# Patient Record
Sex: Female | Born: 1950 | Race: Black or African American | Hispanic: No | Marital: Married | Smoking: Former smoker
Health system: Southern US, Community
[De-identification: ages and names within clinical notes are randomized; demographics above are authoritative.]

## PROBLEM LIST (undated history)

## (undated) DIAGNOSIS — I10 Essential (primary) hypertension: Secondary | ICD-10-CM

## (undated) DIAGNOSIS — I6529 Occlusion and stenosis of unspecified carotid artery: Secondary | ICD-10-CM

## (undated) DIAGNOSIS — E119 Type 2 diabetes mellitus without complications: Secondary | ICD-10-CM

## (undated) HISTORY — DX: Morbid (severe) obesity due to excess calories: E66.01

## (undated) HISTORY — DX: Occlusion and stenosis of unspecified carotid artery: I65.29

## (undated) HISTORY — PX: TONSILLECTOMY: SUR1361

## (undated) HISTORY — DX: Essential (primary) hypertension: I10

---

## 2006-01-02 ENCOUNTER — Emergency Department (HOSPITAL_COMMUNITY): Admission: EM | Admit: 2006-01-02 | Discharge: 2006-01-02 | Payer: Self-pay | Admitting: Emergency Medicine

## 2013-04-19 ENCOUNTER — Other Ambulatory Visit: Payer: Self-pay

## 2013-04-19 DIAGNOSIS — Z1231 Encounter for screening mammogram for malignant neoplasm of breast: Secondary | ICD-10-CM

## 2013-05-09 ENCOUNTER — Ambulatory Visit: Payer: Self-pay

## 2013-12-19 ENCOUNTER — Emergency Department (HOSPITAL_COMMUNITY): Payer: TRICARE For Life (TFL)

## 2013-12-19 ENCOUNTER — Emergency Department (HOSPITAL_COMMUNITY)
Admission: EM | Admit: 2013-12-19 | Discharge: 2013-12-19 | Disposition: A | Discharge status: Home / Self Care | Payer: TRICARE For Life (TFL) | Attending: Emergency Medicine | Admitting: Emergency Medicine

## 2013-12-19 ENCOUNTER — Encounter (HOSPITAL_COMMUNITY): Payer: Self-pay | Admitting: Emergency Medicine

## 2013-12-19 DIAGNOSIS — Z794 Long term (current) use of insulin: Secondary | ICD-10-CM | POA: Insufficient documentation

## 2013-12-19 DIAGNOSIS — R079 Chest pain, unspecified: Secondary | ICD-10-CM

## 2013-12-19 DIAGNOSIS — R42 Dizziness and giddiness: Secondary | ICD-10-CM | POA: Insufficient documentation

## 2013-12-19 DIAGNOSIS — R112 Nausea with vomiting, unspecified: Secondary | ICD-10-CM | POA: Insufficient documentation

## 2013-12-19 DIAGNOSIS — Z79899 Other long term (current) drug therapy: Secondary | ICD-10-CM | POA: Insufficient documentation

## 2013-12-19 DIAGNOSIS — I10 Essential (primary) hypertension: Secondary | ICD-10-CM | POA: Insufficient documentation

## 2013-12-19 DIAGNOSIS — R51 Headache: Secondary | ICD-10-CM | POA: Insufficient documentation

## 2013-12-19 DIAGNOSIS — E119 Type 2 diabetes mellitus without complications: Secondary | ICD-10-CM | POA: Insufficient documentation

## 2013-12-19 DIAGNOSIS — R519 Headache, unspecified: Secondary | ICD-10-CM

## 2013-12-19 HISTORY — DX: Type 2 diabetes mellitus without complications: E11.9

## 2013-12-19 HISTORY — DX: Essential (primary) hypertension: I10

## 2013-12-19 LAB — I-STAT TROPONIN, ED: Troponin i, poc: 0 ng/mL (ref 0.00–0.08)

## 2013-12-19 LAB — COMPREHENSIVE METABOLIC PANEL
ALBUMIN: 3.3 g/dL — AB (ref 3.5–5.2)
ALK PHOS: 95 U/L (ref 39–117)
ALT: 24 U/L (ref 0–35)
AST: 19 U/L (ref 0–37)
BILIRUBIN TOTAL: 0.3 mg/dL (ref 0.3–1.2)
BUN: 17 mg/dL (ref 6–23)
CHLORIDE: 100 meq/L (ref 96–112)
CO2: 22 meq/L (ref 19–32)
CREATININE: 1.07 mg/dL (ref 0.50–1.10)
Calcium: 9.5 mg/dL (ref 8.4–10.5)
GFR calc Af Amer: 63 mL/min — ABNORMAL LOW (ref >90)
GFR, EST NON AFRICAN AMERICAN: 54 mL/min — AB (ref >90)
GLUCOSE: 194 mg/dL — AB (ref 70–99)
POTASSIUM: 4.1 meq/L (ref 3.7–5.3)
Sodium: 139 mEq/L (ref 137–147)
Total Protein: 7.3 g/dL (ref 6.0–8.3)

## 2013-12-19 LAB — CBG MONITORING, ED: GLUCOSE-CAPILLARY: 185 mg/dL — AB (ref 70–99)

## 2013-12-19 LAB — CBC
HEMATOCRIT: 39.1 % (ref 36.0–46.0)
HEMOGLOBIN: 12.8 g/dL (ref 12.0–15.0)
MCH: 26.3 pg (ref 26.0–34.0)
MCHC: 32.7 g/dL (ref 30.0–36.0)
MCV: 80.3 fL (ref 78.0–100.0)
Platelets: 289 10*3/uL (ref 150–400)
RBC: 4.87 MIL/uL (ref 3.87–5.11)
RDW: 13.4 % (ref 11.5–15.5)
WBC: 8 10*3/uL (ref 4.0–10.5)

## 2013-12-19 MED ORDER — ONDANSETRON HCL 4 MG/2ML IJ SOLN
4.0000 mg | Freq: Once | INTRAMUSCULAR | Status: AC
  Administered 2013-12-19: 4 mg via INTRAVENOUS
  Filled 2013-12-19: qty 2

## 2013-12-19 MED ORDER — SODIUM CHLORIDE 0.9 % IV SOLN
INTRAVENOUS | Status: DC
  Administered 2013-12-19: 19:00:00 via INTRAVENOUS

## 2013-12-19 MED ORDER — PROMETHAZINE HCL 25 MG/ML IJ SOLN
12.5000 mg | Freq: Once | INTRAMUSCULAR | Status: AC
  Administered 2013-12-19: 12.5 mg via INTRAVENOUS
  Filled 2013-12-19: qty 1

## 2013-12-19 MED ORDER — HYDROMORPHONE HCL PF 1 MG/ML IJ SOLN
1.0000 mg | Freq: Once | INTRAMUSCULAR | Status: AC
  Administered 2013-12-19: 1 mg via INTRAVENOUS
  Filled 2013-12-19: qty 1

## 2013-12-19 MED ORDER — SODIUM CHLORIDE 0.9 % IV BOLUS (SEPSIS)
500.0000 mL | Freq: Once | INTRAVENOUS | Status: AC
Start: 2013-12-19 — End: 2013-12-19
  Administered 2013-12-19: 500 mL via INTRAVENOUS

## 2013-12-19 NOTE — ED Provider Notes (Signed)
After patient was given additional antiemetics.  Her headache, resolved totally.  She is able to tolerate fluids, and she's feeling better.  She's been encouraged to monitor her blood pressure carefully, and to follow up with her primary care physician  Arman FilterGail K Hadeel Hillebrand, NP 12/19/13 2129

## 2013-12-19 NOTE — ED Notes (Signed)
PT c/o dizziness (like the room is spinning), frontal headache and vomiting since last week. Pt also c/o R sided chest pain since Monday.

## 2013-12-19 NOTE — ED Notes (Signed)
Patient given PO challenge

## 2013-12-19 NOTE — Discharge Instructions (Signed)
Please monitor your blood pressure Follow up with your PCP

## 2013-12-19 NOTE — ED Provider Notes (Signed)
CSN: [id number]     Arrival date & time 12/19/13  1644 History   First MD Initiated Contact with Patient 12/19/13 1710     Chief Complaint  Patient presents with  . Chest Pain  . Headache  . Dizziness     (Consider location/radiation/quality/duration/timing/severity/associated sxs/prior Treatment) Patient is a 63 y.o. female presenting with chest pain, headaches, and dizziness. The history is provided by the patient and the spouse.  Chest Pain Associated symptoms: dizziness, headache, nausea and vomiting   Associated symptoms: no abdominal pain, no back pain, no fever, no numbness, no shortness of breath and no weakness   Headache Associated symptoms: dizziness, nausea and vomiting   Associated symptoms: no abdominal pain, no back pain, no congestion, no fever, no numbness, no photophobia and no seizures   Dizziness Associated symptoms: chest pain, headaches, nausea and vomiting   Associated symptoms: no shortness of breath    patient with one-week history of frontal headache bilateral feels behind the eyes Pain. No visual changes. Associated with some nausea and vomiting for the past several days. Patient has a history of diabetes and hypertension. Blood pressure here today has been significantly elevated. Patient denies fevers denies a stiff neck or neck pain. Has had some chest pain intermittent over the last several days. Today had a brief period of chest pain lasting less than 15 minutes. Yesterday it did last longer. Chest pain is right-sided nonradiating currently 0/10 when it's present is 8/10. Headache is 10 out of 10. Patient primary care Dr. is in [place].  Past Medical History  Diagnosis Date  . Diabetes mellitus without complication   . Hypertension    Past Surgical History  Procedure Laterality Date  . Tonsillectomy     No family history on file. History  Substance Use Topics  . Smoking status: Not on file  . Smokeless tobacco: Not on file  . Alcohol Use: Not on  file   OB History   Grav Para Term Preterm Abortions TAB SAB Ect Mult Living                 Review of Systems  Constitutional: Negative for fever.  HENT: Negative for congestion.   Eyes: Negative for photophobia and visual disturbance.  Respiratory: Negative for shortness of breath.   Cardiovascular: Positive for chest pain.  Gastrointestinal: Positive for nausea and vomiting. Negative for abdominal pain.  Genitourinary: Negative for dysuria.  Musculoskeletal: Negative for back pain.  Skin: Negative for rash.  Neurological: Positive for dizziness and headaches. Negative for seizures, syncope, speech difficulty, weakness and numbness.  Hematological: Does not bruise/bleed easily.  Psychiatric/Behavioral: Negative for confusion.      Allergies  Review of patient's allergies indicates no known allergies.  Home Medications   Current Outpatient Rx  Name  Route  Sig  Dispense  Refill  . Insulin Detemir (LEVEMIR FLEXPEN) 100 UNIT/ML Pen   Subcutaneous   Inject 40 Units into the skin 2 (two) times daily.         . Liraglutide (VICTOZA) 18 MG/3ML SOPN   Subcutaneous   Inject 18 mg into the skin every morning.         Marland Kitchen lisinopril (PRINIVIL,ZESTRIL) 5 MG tablet   Oral   Take 5 mg by mouth daily.         . metFORMIN (GLUCOPHAGE) 500 MG tablet   Oral   Take 500 mg by mouth 2 (two) times daily with a meal.         .  ranitidine (ZANTAC) 150 MG tablet   Oral   Take 150 mg by mouth 2 (two) times daily.         . simvastatin (ZOCOR) 10 MG tablet   Oral   Take 10 mg by mouth daily.          BP 190/58  Pulse 90  Temp(Src) 97.9 F (36.6 C) (Oral)  Resp 18  Ht 5\' 5"  (1.651 m)  Wt 211 lb (95.709 kg)  BMI 35.11 kg/m2  SpO2 99% Physical Exam  Nursing note and vitals reviewed. Constitutional: She is oriented to person, place, and time. She appears well-developed and well-nourished. No distress.  HENT:  Head: Normocephalic and atraumatic.  Mouth/Throat:  Oropharynx is clear and moist.  Eyes: Conjunctivae and EOM are normal. Pupils are equal, round, and reactive to light.  Neck: Normal range of motion. Neck supple.  Cardiovascular: Normal rate, regular rhythm and normal heart sounds.   No murmur heard. Pulmonary/Chest: Effort normal and breath sounds normal. No respiratory distress.  Abdominal: Soft. Bowel sounds are normal. There is no tenderness.  Musculoskeletal: Normal range of motion.  Neurological: She is alert and oriented to person, place, and time. No cranial nerve deficit. She exhibits normal muscle tone. Coordination normal.  Skin: Skin is warm.    ED Course  Procedures (including critical care time) Labs Review Labs Reviewed  COMPREHENSIVE METABOLIC PANEL - Abnormal; Notable for the following:    Glucose, Bld 194 (*)    Albumin 3.3 (*)    GFR calc non Af Amer 54 (*)    GFR calc Af Amer 63 (*)    All other components within normal limits  CBG MONITORING, ED - Abnormal; Notable for the following:    Glucose-Capillary 185 (*)    All other components within normal limits  CBC  I-STAT TROPOININ, ED   Results for orders placed during the hospital encounter of 12/19/13  CBC      Result Value Ref Range   WBC 8.0  4.0 - 10.5 K/uL   RBC 4.87  3.87 - 5.11 MIL/uL   Hemoglobin 12.8  12.0 - 15.0 g/dL   HCT 16.1  09.6 - 04.5 %   MCV 80.3  78.0 - 100.0 fL   MCH 26.3  26.0 - 34.0 pg   MCHC 32.7  30.0 - 36.0 g/dL   RDW 40.9  81.1 - 91.4 %   Platelets 289  150 - 400 K/uL  COMPREHENSIVE METABOLIC PANEL      Result Value Ref Range   Sodium 139  137 - 147 mEq/L   Potassium 4.1  3.7 - 5.3 mEq/L   Chloride 100  96 - 112 mEq/L   CO2 22  19 - 32 mEq/L   Glucose, Bld 194 (*) 70 - 99 mg/dL   BUN 17  6 - 23 mg/dL   Creatinine, Ser 7.82  0.50 - 1.10 mg/dL   Calcium 9.5  8.4 - 95.6 mg/dL   Total Protein 7.3  6.0 - 8.3 g/dL   Albumin 3.3 (*) 3.5 - 5.2 g/dL   AST 19  0 - 37 U/L   ALT 24  0 - 35 U/L   Alkaline Phosphatase 95  39 - 117  U/L   Total Bilirubin 0.3  0.3 - 1.2 mg/dL   GFR calc non Af Amer 54 (*) >90 mL/min   GFR calc Af Amer 63 (*) >90 mL/min  CBG MONITORING, ED      Result Value Ref Range  Glucose-Capillary 185 (*) 70 - 99 mg/dL  I-STAT TROPOININ, ED      Result Value Ref Range   Troponin i, poc 0.00  0.00 - 0.08 ng/mL   Comment 3             Imaging Review Dg Chest 2 View  12/19/2013   CLINICAL DATA:  Dizziness  EXAM: CHEST  2 VIEW  COMPARISON:  None.  FINDINGS: The heart size and mediastinal contours are within normal limits. Both lungs are clear. The visualized skeletal structures are unremarkable.  IMPRESSION: No active cardiopulmonary disease.   Electronically Signed   By: Alcide CleverMark  Lukens M.D.   On: 12/19/2013 18:53   Ct Head Wo Contrast  12/19/2013   CLINICAL DATA:  Dizziness, frontal headache, vomiting  EXAM: CT HEAD WITHOUT CONTRAST  TECHNIQUE: Contiguous axial images were obtained from the base of the skull through the vertex without intravenous contrast.  COMPARISON:  None.  FINDINGS: There is no evidence of mass effect, midline shift or extra-axial fluid collections. There is no evidence of a space-occupying lesion or intracranial hemorrhage. There is no evidence of a cortical-based area of acute infarction.  The ventricles and sulci are appropriate for the patient's age. The basal cisterns are patent.  Visualized portions of the orbits are unremarkable. The visualized portions of the paranasal sinuses and mastoid air cells are unremarkable.  The osseous structures are unremarkable.  IMPRESSION: No acute intracranial pathology.   Electronically Signed   By: Elige KoHetal  Patel   On: 12/19/2013 18:58     EKG Interpretation   Date/Time:  Thursday December 19 2013 16:50:11 EDT Ventricular Rate:  97 PR Interval:  150 QRS Duration: 78 QT Interval:  340 QTC Calculation: 431 R Axis:   100 Text Interpretation:  Normal sinus rhythm Low voltage QRS Possible  Anterolateral infarct , age undetermined Abnormal ECG  Confirmed by  Verlaine Embry  MD, Kaimen Peine [phone] on 12/19/2013 7:06:28 PM      MDM   Final diagnoses:  Headache  Chest pain  Hypertension    Patient presents with the complaint of frontal headache been present for 1 week. Associated with nausea and vomiting for the past several days. Also with some intermittent right-sided chest pain had one episode today lasting 15 minutes had an episode yesterday that was more than 15 minutes. Patient's troponins negative EKG no acute cardiac changes. Head CT negative. Headache improves some with hydromorphone 1 dose. Patient has a known history of hypertension. Blood pressure at the highest was 219/86 after of pain medication was down to 190 systolic. As stated head CT was negative. Labs without significant abnormalities. No leukocytosis no neck stiffness no concerns for meningitis. Patient also has a history of diabetes blood sugar is under 200.  Have re\re dose with another dose of hydromorphone and some Phenergan for the nausea. We'll reassess.    Shelda JakesScott W. Nels Munn, MD 12/19/13 [phone]

## 2013-12-23 NOTE — ED Provider Notes (Signed)
Medical screening examination/treatment/procedure(s) were conducted as a shared visit with non-physician practitioner(s) and myself.  I personally evaluated the patient during the encounter.   EKG Interpretation   Date/Time:  Thursday December 19 2013 16:50:11 EDT Ventricular Rate:  97 PR Interval:  150 QRS Duration: 78 QT Interval:  340 QTC Calculation: 431 R Axis:   100 Text Interpretation:  Normal sinus rhythm Low voltage QRS Possible  Anterolateral infarct , age undetermined Abnormal ECG Confirmed by  Deretha EmoryZACKOWSKI  MD, Aarya Quebedeaux [phone] on 12/19/2013 7:06:28 PM        Shelda JakesScott W. Arlyn Bumpus, MD 12/23/13 [id number]

## 2016-06-01 DIAGNOSIS — E785 Hyperlipidemia, unspecified: Secondary | ICD-10-CM | POA: Insufficient documentation

## 2016-06-01 DIAGNOSIS — E119 Type 2 diabetes mellitus without complications: Secondary | ICD-10-CM | POA: Insufficient documentation

## 2016-06-01 DIAGNOSIS — E11319 Type 2 diabetes mellitus with unspecified diabetic retinopathy without macular edema: Secondary | ICD-10-CM | POA: Insufficient documentation

## 2017-08-08 ENCOUNTER — Other Ambulatory Visit (HOSPITAL_COMMUNITY): Payer: Self-pay | Admitting: Family Medicine

## 2017-08-08 DIAGNOSIS — R0989 Other specified symptoms and signs involving the circulatory and respiratory systems: Secondary | ICD-10-CM

## 2017-08-11 ENCOUNTER — Encounter: Payer: Self-pay | Admitting: Gynecology

## 2017-08-11 ENCOUNTER — Ambulatory Visit (INDEPENDENT_AMBULATORY_CARE_PROVIDER_SITE_OTHER): Payer: Medicare Other | Admitting: Gynecology

## 2017-08-11 VITALS — BP 130/80 | Ht 65.0 in | Wt 236.0 lb

## 2017-08-11 DIAGNOSIS — R87610 Atypical squamous cells of undetermined significance on cytologic smear of cervix (ASC-US): Secondary | ICD-10-CM | POA: Diagnosis not present

## 2017-08-11 DIAGNOSIS — R87810 Cervical high risk human papillomavirus (HPV) DNA test positive: Secondary | ICD-10-CM | POA: Diagnosis not present

## 2017-08-11 NOTE — Progress Notes (Addendum)
    Ashley Sullivan Nov 13, 1950 [id number]        66 y.o.  G1P1 new patient [facility], Lorelee CoverBridget Condit, NP for first abnormal Pap smear showing ASCUS with positive high risk HPV.  Patient presented for her annual exam which included a breast and pelvic exam.  No history of abnormal Pap smears previously.  No new sexual partners.    Past medical history,surgical history, problem list, medications, allergies, family history and social history were all reviewed and documented in the EPIC chart.  Directed ROS with pertinent positives and negatives documented in the history of present illness/assessment and plan.  Exam: Kennon PortelaKim Gardner assistant Vitals:   08/11/17 1133  BP: 130/80  Weight: 236 lb (107 kg)  Height: 5\' 5"  (1.651 m)   General appearance:  Normal Abdomen obese nontender without gross masses Pelvic external BUS vagina with atrophic changes.  Cervix with atrophic changes.  Uterus difficult to palpate but no gross masses or tenderness.  Adnexa without gross masses or tenderness.  Colposcopy performed after acetic acid cleanse was inadequate with no transformation zone seen.  No abnormalities were seen.  Cervical loss was stenotic and subsequently a single-tooth tenaculum was applied to the anterior lip of the cervix and the cervix was gently dilated with a disposable dilator.  No abnormalities again were seen.  An ECC was performed.  Patient tolerated well. Physical Exam  Genitourinary:       Assessment/Plan:  66 y.o. G1P1 with first abnormal Pap smear showing ASCUS with positive high risk HPV.  Colposcopy was inadequate but no abnormality seen.  ECC performed.  If ECC negative then plan expectant management with follow-up Pap smear/HPV in 1 year.  If ECC otherwise abnormal then will triaged based upon results.  Patient and I had an extensive discussion about abnormal Pap smears and the HPV association.  We discussed dysplasia  low-grade/high-grade, progression/regression.  Patient's questions were answered and she was provided with literature discussing these issues.  She will follow-up for biopsy results.  She knows the importance of follow-up long-term.  Greater than 50% of my time was spent in direct face to face counseling and coordination of care with the patient.  Consult note from this visit was sent to the referring provider Valeda MalmBridgit Condit, NP    Dara Lordsimothy P Kerri Kovacik MD, 12:12 PM 08/11/2017

## 2017-08-11 NOTE — Patient Instructions (Signed)
Office will call you with biopsy results 

## 2017-08-12 ENCOUNTER — Other Ambulatory Visit: Payer: Self-pay | Admitting: Family Medicine

## 2017-08-12 DIAGNOSIS — Z780 Asymptomatic menopausal state: Secondary | ICD-10-CM

## 2017-08-15 ENCOUNTER — Ambulatory Visit (HOSPITAL_COMMUNITY)
Admission: RE | Admit: 2017-08-15 | Discharge: 2017-08-15 | Disposition: A | Discharge status: Home / Self Care | Payer: Medicare Other | Source: Ambulatory Visit | Attending: Family Medicine | Admitting: Family Medicine

## 2017-08-15 DIAGNOSIS — R0989 Other specified symptoms and signs involving the circulatory and respiratory systems: Secondary | ICD-10-CM

## 2017-08-15 DIAGNOSIS — I6523 Occlusion and stenosis of bilateral carotid arteries: Secondary | ICD-10-CM | POA: Diagnosis not present

## 2017-08-15 LAB — PATHOLOGY

## 2017-08-15 LAB — TISSUE SPECIMEN

## 2017-08-15 NOTE — Progress Notes (Signed)
Carotid duplex preliminary: Right 60-79% ICA stenosis, high end of range. Left 40-59% ICA stenosis, mid range. Farrel DemarkJill Eunice, RDMS, RVT

## 2017-09-14 NOTE — Progress Notes (Signed)
Cardiology Office Note   Date:  09/15/2017   ID:  Ashley Sullivan, DOB [birth year], MRN [id number]  PCP:  Larene Pickett, FNP  Cardiologist:   Chilton Si, MD   Chief Complaint  Patient presents with  . New Patient (Initial Visit)  . Dizziness    occasionally.     History of Present Illness: Ashley Sullivan is a 67 y.o. female with a moderate carotid stenosis, hypertension and diabetes who is being seen today for the evaluation of carotid stenosis at the request of Larene Pickett, FNP.  Ashley Sullivan saw Ashley Cover, NP on 06/2017 and was noted to have a carotid bruit.  She was referred for carotid Dopplers 08/15/17 that revealed 60-79% right ICA stenosis and left 40-59% ICA stenosis.  Ashley Sullivan has been feeling well.  She denies any headaches, slurred speech, numbness, tingling, or weakness.  She has been seeing an ophthalmologist for diabetic retinopathy.  This is the only known cause for her vision changes in her vision has improved since starting treatment.  She denies chest pain or shortness of breath.  She does not get any lower extremity edema, orthopnea, or PND.  She notes that she is not very active and that she has not been exercising lately.  In the past she liked to work out at [employer] with a Systems analyst.  However, her husband stop paying for the sessions and she stopped exercising.  She recently gave up smoking 4 days ago.  She is previously been able to go up to a year without smoking.  However, when her husband started back smoking she started back to.  She has smoked most of her adult life.  She is currently using nicotine replacement gum.  She notes that her  mother died of a heart attack in her 30s.  She sometimes checks her blood pressure at home and it typically is in the 120s systolic.  She reports being somewhat upset this morning because she had her husband got into an argument.  She thinks that this is why his blood pressure is elevated today.   Past Medical  History:  Diagnosis Date  . Carotid stenosis 09/15/2017  . Diabetes mellitus without complication (HCC)   . Essential hypertension 09/15/2017  . Hypertension   . Morbid obesity (HCC) 09/15/2017    Past Surgical History:  Procedure Laterality Date  . TONSILLECTOMY       Current Outpatient Medications  Medication Sig Dispense Refill  . amLODipine (NORVASC) 10 MG tablet Take 10 mg by mouth daily.    Marland Kitchen aspirin EC 81 MG tablet Take 81 mg by mouth daily.    . Dulaglutide (TRULICITY [place]) Inject into the skin.    . Insulin Degludec (TRESIBA FLEXTOUCH [place]) Inject into the skin.    Marland Kitchen losartan-hydrochlorothiazide (HYZAAR) 100-25 MG tablet Take 1 tablet by mouth daily.    . metFORMIN (GLUCOPHAGE) 500 MG tablet Take 500 mg by mouth 2 (two) times daily with a meal.    . rosuvastatin (CRESTOR) 20 MG tablet Take 20 mg by mouth daily.     No current facility-administered medications for this visit.     Allergies:   Patient has no known allergies.    Social History:  The patient  reports that she has been smoking.  she has never used smokeless tobacco. She reports that she does not drink alcohol or use drugs.   Family History:  The patient's family history includes Diabetes in her mother, sister, sister,  and sister; Heart attack in her mother; Heart failure in her mother.    ROS:  Please see the history of present illness.   Otherwise, review of systems are positive for none.   All other systems are reviewed and negative.    PHYSICAL EXAM: VS:  BP (!) 150/68   Pulse 65   Ht 5\' 5"  (1.651 m)   Wt 241 lb 9.6 oz (109.6 kg)   BMI 40.20 kg/m  , BMI Body mass index is 40.2 kg/m. GENERAL:  Well appearing HEENT:  Pupils equal round and reactive, fundi not visualized, oral mucosa unremarkable NECK:  No jugular venous distention, waveform within normal limits, carotid upstroke brisk and symmetric, bilateral carotid bruits, no thyromegaly LYMPHATICS:  No cervical adenopathy LUNGS:  Clear to auscultation  bilaterally HEART:  RRR.  PMI not displaced or sustained,S1 and S2 within normal limits, no S3, no S4, no clicks, no rubs, no murmurs ABD:  Flat, positive bowel sounds normal in frequency in pitch, no bruits, no rebound, no guarding, no midline pulsatile mass, no hepatomegaly, no splenomegaly EXT:  2 plus radial pulses.  1+ R DP.  Unable to palpate R TP or L DP/TP. No edema, no cyanosis no clubbing SKIN:  No rashes no nodules NEURO:  Cranial nerves II through XII grossly intact, motor grossly intact throughout PSYCH:  Cognitively intact, oriented to person place and time   EKG:  EKG is ordered today. The ekg ordered today demonstrates sinus rhythm rate 65 bpm.  Low voltage precordial leads.  Non-specific ST-T chagnes.    Carotid Dopplers 08/15/17: 60-79% right ICA stenosis and left 40-59% ICA stenosis.  Recent Labs: No results found for requested labs within last 8760 hours.    Lipid Panel No results found for: CHOL, TRIG, HDL, CHOLHDL, VLDL, LDLCALC, LDLDIRECT    Wt Readings from Last 3 Encounters:  09/15/17 241 lb 9.6 oz (109.6 kg)  08/11/17 236 lb (107 kg)  12/19/13 211 lb (95.7 kg)      ASSESSMENT AND PLAN:  # Carotid stenosis:  # Hyperlipidemia: Ashley Sullivan has moderate carotid stenosis bilaterally.  We discussed the importance of continuing to abstain from smoking.  She is already taking aspirin 81 mg daily and rosuvastatin.  We will check her lipids today.  LDL should be less than 70.  Repeat carotid Dopplers 08/2018 and follow-up with me after that.  # Hypertension:  Blood pressure is elevated here both initially and on repeat.  It should be less than 130/80.  She will check her blood pressure twice daily and log the results.  Follow-up with Pharm.D. in 2 weeks for blood pressure check.  Continue amlodipine, losartan, and hydrochlorthiazide.  # Morbid Obesity: Ashley Sullivan was encouraged to increase her exercise to at least 30-40 minutes most days of the week.  She expressed  understanding.     Current medicines are reviewed at length with the patient today.  The patient does not have concerns regarding medicines.  The following changes have been made:  no change  Labs/ tests ordered today include:   Orders Placed This Encounter  Procedures  . Comprehensive metabolic panel  . Lipid panel  . EKG 12-Lead     Disposition:   FU with Ashley Bielicki C. Duke Salviaandolph, MD, [facility] in 1 year.  PharmD in 2 weeks.     This note was written with the assistance of speech recognition software.  Please excuse any transcriptional errors.  Signed, Azul Brumett C. Duke Salviaandolph, MD, [facility]  09/15/2017 9:34 AM  [facility]

## 2017-09-15 ENCOUNTER — Encounter: Payer: Self-pay | Admitting: Cardiovascular Disease

## 2017-09-15 ENCOUNTER — Encounter (INDEPENDENT_AMBULATORY_CARE_PROVIDER_SITE_OTHER): Payer: Self-pay

## 2017-09-15 ENCOUNTER — Ambulatory Visit (INDEPENDENT_AMBULATORY_CARE_PROVIDER_SITE_OTHER): Payer: Medicare Other | Admitting: Cardiovascular Disease

## 2017-09-15 VITALS — BP 150/68 | HR 65 | Ht 65.0 in | Wt 241.6 lb

## 2017-09-15 DIAGNOSIS — I6523 Occlusion and stenosis of bilateral carotid arteries: Secondary | ICD-10-CM

## 2017-09-15 DIAGNOSIS — I10 Essential (primary) hypertension: Secondary | ICD-10-CM

## 2017-09-15 DIAGNOSIS — I6529 Occlusion and stenosis of unspecified carotid artery: Secondary | ICD-10-CM

## 2017-09-15 DIAGNOSIS — Z5181 Encounter for therapeutic drug level monitoring: Secondary | ICD-10-CM | POA: Diagnosis not present

## 2017-09-15 HISTORY — DX: Morbid (severe) obesity due to excess calories: E66.01

## 2017-09-15 HISTORY — DX: Essential (primary) hypertension: I10

## 2017-09-15 HISTORY — DX: Occlusion and stenosis of unspecified carotid artery: I65.29

## 2017-09-15 LAB — COMPREHENSIVE METABOLIC PANEL
ALBUMIN: 3.8 g/dL (ref 3.6–4.8)
ALT: 31 IU/L (ref 0–32)
AST: 30 IU/L (ref 0–40)
Albumin/Globulin Ratio: 1.5 (ref 1.2–2.2)
Alkaline Phosphatase: 74 IU/L (ref 39–117)
BUN / CREAT RATIO: 14 (ref 12–28)
BUN: 24 mg/dL (ref 8–27)
Bilirubin Total: <0.2 mg/dL (ref 0.0–1.2)
CALCIUM: 9.2 mg/dL (ref 8.7–10.3)
CO2: 21 mmol/L (ref 20–29)
CREATININE: 1.72 mg/dL — AB (ref 0.57–1.00)
Chloride: 107 mmol/L — ABNORMAL HIGH (ref 96–106)
GFR calc non Af Amer: 31 mL/min/{1.73_m2} — ABNORMAL LOW (ref >59)
GFR, EST AFRICAN AMERICAN: 35 mL/min/{1.73_m2} — AB (ref >59)
GLUCOSE: 151 mg/dL — AB (ref 65–99)
Globulin, Total: 2.5 g/dL (ref 1.5–4.5)
Potassium: 4.3 mmol/L (ref 3.5–5.2)
Sodium: 142 mmol/L (ref 134–144)
Total Protein: 6.3 g/dL (ref 6.0–8.5)

## 2017-09-15 LAB — LIPID PANEL
Chol/HDL Ratio: 3.4 ratio (ref 0.0–4.4)
Cholesterol, Total: 121 mg/dL (ref 100–199)
HDL: 36 mg/dL — ABNORMAL LOW (ref >39)
LDL CALC: 55 mg/dL (ref 0–99)
Triglycerides: 148 mg/dL (ref 0–149)
VLDL Cholesterol Cal: 30 mg/dL (ref 5–40)

## 2017-09-15 NOTE — Patient Instructions (Signed)
Medication Instructions:  Your physician recommends that you continue on your current medications as directed. Please refer to the Current Medication list given to you today.  Labwork: LP/CMET TODAY   Testing/Procedures: Your physician has requested that you have a carotid duplex. This test is an ultrasound of the carotid arteries in your neck. It looks at blood flow through these arteries that supply the brain with blood. Allow one hour for this exam. There are no restrictions or special instructions. 1 YEAR OV   Follow-Up: Your physician recommends that you schedule a follow-up appointment in: 2 WEEKS WITH PHARM D IN BLOOD PRESSURE CLINIC   Your physician wants you to follow-up in: 1 [facility] You will receive a reminder letter in the mail two months in advance. If you don't receive a letter, please call our office to schedule the follow-up appointment.  Any Other Special Instructions Will Be Listed Below (If Applicable). MONITOR YOUR BLOOD PRESSURE TWICE A DAY FOR 2 WEEKS BRING TO YOUR FOLLOW UP WITH PHARM D   If you need a refill on your cardiac medications before your next appointment, please call your pharmacy.

## 2017-09-15 NOTE — Assessment & Plan Note (Signed)
08/15/17: 60-79% right ICA stenosis and left 40-59% ICA stenosis.

## 2017-09-22 ENCOUNTER — Other Ambulatory Visit: Payer: Self-pay | Admitting: Family Medicine

## 2017-09-22 DIAGNOSIS — E2839 Other primary ovarian failure: Secondary | ICD-10-CM

## 2017-09-25 ENCOUNTER — Inpatient Hospital Stay
Admission: RE | Admit: 2017-09-25 | Discharge: 2017-09-25 | Disposition: A | Discharge status: Home / Self Care | Payer: Medicare Other | Source: Ambulatory Visit | Attending: Family Medicine | Admitting: Family Medicine

## 2017-09-26 ENCOUNTER — Telehealth: Payer: Self-pay | Admitting: *Deleted

## 2017-09-26 NOTE — Telephone Encounter (Signed)
-----   Message from Chilton Siiffany Orme, MD sent at 09/25/2017 10:17 AM EST ----- Kidney function is significantly worse than 3 years ago when last checked.  This may be due to the hypertension.  Repeat BMP when she comes for hypertension clinic.  Follow up with PCP about this.

## 2017-09-26 NOTE — Telephone Encounter (Signed)
Advised patient of lab results, sent to PCP, and order for recheck placed

## 2017-09-28 ENCOUNTER — Encounter: Payer: Self-pay | Admitting: Pharmacist

## 2017-09-28 ENCOUNTER — Ambulatory Visit (INDEPENDENT_AMBULATORY_CARE_PROVIDER_SITE_OTHER): Payer: Medicare Other | Admitting: Pharmacist

## 2017-09-28 VITALS — BP 130/72 | HR 58

## 2017-09-28 DIAGNOSIS — I10 Essential (primary) hypertension: Secondary | ICD-10-CM | POA: Diagnosis not present

## 2017-09-28 DIAGNOSIS — I6523 Occlusion and stenosis of bilateral carotid arteries: Secondary | ICD-10-CM

## 2017-09-28 NOTE — Assessment & Plan Note (Addendum)
According to the 2017 Hypertension Guidelines and the ADA 2018 Guidelines, patient's blood pressure goal is < 130/80. Although patient's blood pressure average in the mornings at home was 142/64, evenings blood pressure average was 122/63. Her BP was within goal in the office today at 130/72.  Most recent BMET done 12 days ago was 1.7 showing an increase form baseline Scr of 1.07. Patient is going to continue blood pressure medications without changes, continue working on lifestyle modifications, and continue to check her blood pressure twice a day to bring record to follow up clinic in 4 weeks. BMET repeat done today to re-assess renal function prior to any additional medication changes.

## 2017-09-28 NOTE — Progress Notes (Signed)
Patient ID: Ashley Sullivan                 DOB: Jun 27, 1951                      MRN: [id number]     HPI: Ashley Sullivan is a 67 y.o. female patient of Dr Duke Salvia, with a PMH significant for carotid stenosis, diabetes mellitus without complication, hypertension, and morbid obesity who presents today for hypertension clinic evaluation. Patient states that she felt dizzy on losartan-hydrochlorothiazide 100-25 mg tablets; therefore, about a year ago she started taking the medication at night and that has helped her feel better. She denies headaches, chest pain, increased fatigue, and swelling.   Current HTN meds:  Amlodipine 10 mg daily (morning) Losartan-Hydrochlorothiazide 100-25 mg tablet daily (bedtime)  Previously tried: None  BP goal: <130/80 (Based on diabetes and hypertension guidelines)  Family History: Her mother had a heart attack at 71 and past away. She had heart failure previously. She does not know the cause of death of her father. Five of her siblings have diabetes and hypertension. One of her siblings just has hypertension.    Social History: She gave up smoking at the beginning of this year. Currently using nicotine replacement gum. She has smoked most of her adult life. She does not drink alcohol.  Diet: She is trying to follow a low sodium diet. She currently is not using salt when she cooks her food. She reports to not eating sausage, bacon, etc.  Exercise:  She walks occasionally with her daughter.  Home BP readings:  She uses a wrist cuff at home. Patient's average morning blood pressure is 142/64 (based on 7 readings) Patient's average nighttime blood pressure is 122/63 (based on 5 readings)  Wt Readings from Last 3 Encounters:  09/15/17 241 lb 9.6 oz (109.6 kg)  08/11/17 236 lb (107 kg)  12/19/13 211 lb (95.7 kg)   BP Readings from Last 3 Encounters:  09/28/17 130/72  09/15/17 (!) 150/68  08/11/17 130/80   Pulse Readings from Last 3 Encounters:  09/28/17 (!) 58   09/15/17 65  12/19/13 75    Renal function: Estimated Creatinine Clearance: 40.2 mL/min (A) (by C-G formula based on SCr of 1.72 mg/dL (H)).  Past Medical History:  Diagnosis Date  . Carotid stenosis 09/15/2017  . Diabetes mellitus without complication (HCC)   . Essential hypertension 09/15/2017  . Hypertension   . Morbid obesity (HCC) 09/15/2017    Current Outpatient Medications on File Prior to Visit  Medication Sig Dispense Refill  . amLODipine (NORVASC) 10 MG tablet Take 10 mg by mouth daily.    Marland Kitchen aspirin EC 81 MG tablet Take 81 mg by mouth daily.    . Dulaglutide (TRULICITY [place]) Inject into the skin.    . Insulin Degludec (TRESIBA FLEXTOUCH [place]) Inject into the skin.    Marland Kitchen losartan-hydrochlorothiazide (HYZAAR) 100-25 MG tablet Take 1 tablet by mouth daily.    . metFORMIN (GLUCOPHAGE) 500 MG tablet Take 500 mg by mouth 2 (two) times daily with a meal.    . rosuvastatin (CRESTOR) 20 MG tablet Take 20 mg by mouth daily.     No current facility-administered medications on file prior to visit.     No Known Allergies  Blood pressure 130/72, pulse (!) 58, SpO2 99 %.  Essential hypertension According to the 2017 Hypertension Guidelines and the ADA 2018 Guidelines, patient's blood pressure goal is < 130/80. Although patient's blood pressure average in  the mornings at home was 142/64, evenings blood pressure average was 122/63. Her BP was within goal in the office today at 130/72.  Most recent BMET done 12 days ago was 1.7 showing an increase form baseline Scr of 1.07. Patient is going to continue blood pressure medications without changes, continue working on lifestyle modifications, and continue to check her blood pressure twice a day to bring record to follow up clinic in 4 weeks. BMET repeat done today to re-assess renal function prior to any additional medication changes.  Jacqualine Codeebecca Dey PharmD Candidate [facility]  Assessment and plan  discussed and approved by: Pearletha Furlaquel Rodriguez-Guzman PharmD, BCPS, CPP [facility] [address] [place] [phone] 09/28/2017 3:21 PM

## 2017-09-28 NOTE — Patient Instructions (Signed)
Return for a follow up appointment in February 19th at 2:30pm.  Check your blood pressure at home daily (if able) and keep record of the readings.  Take your BP meds as follows: no changes in medications.  Bring all of your meds, your BP cuff and your record of home blood pressures to your next appointment.  Exercise as you're able, try to walk approximately 30 minutes per day.  Keep salt intake to a minimum, especially watch canned and prepared boxed foods.  Eat more fresh fruits and vegetables and fewer canned items.  Avoid eating in fast food restaurants.    HOW TO TAKE YOUR BLOOD PRESSURE: . Rest 5 minutes before taking your blood pressure. .  Don't smoke or drink caffeinated beverages for at least 30 minutes before. . Take your blood pressure before (not after) you eat. . Sit comfortably with your back supported and both feet on the floor (don't cross your legs). . Elevate your arm to heart level on a table or a desk. . Use the proper sized cuff. It should fit smoothly and snugly around your bare upper arm. There should be enough room to slip a fingertip under the cuff. The bottom edge of the cuff should be 1 inch above the crease of the elbow. . Ideally, take 3 measurements at one sitting and record the average.

## 2017-09-29 LAB — BASIC METABOLIC PANEL
BUN/Creatinine Ratio: 12 (ref 12–28)
BUN: 31 mg/dL — AB (ref 8–27)
CO2: 22 mmol/L (ref 20–29)
Calcium: 9.5 mg/dL (ref 8.7–10.3)
Chloride: 106 mmol/L (ref 96–106)
Creatinine, Ser: 2.69 mg/dL — ABNORMAL HIGH (ref 0.57–1.00)
GFR calc Af Amer: 21 mL/min/{1.73_m2} — ABNORMAL LOW (ref >59)
GFR, EST NON AFRICAN AMERICAN: 18 mL/min/{1.73_m2} — AB (ref >59)
GLUCOSE: 173 mg/dL — AB (ref 65–99)
Potassium: 4.8 mmol/L (ref 3.5–5.2)
SODIUM: 144 mmol/L (ref 134–144)

## 2017-10-10 ENCOUNTER — Telehealth: Payer: Self-pay | Admitting: *Deleted

## 2017-10-10 NOTE — Telephone Encounter (Signed)
-----   Message from Chilton Siiffany , MD sent at 10/04/2017  5:58 PM EST ----- Kidney function is getting progressively worse.  Cut losartan/HCTZ in half.  Add hydralazine 25mg  tid.  She needs follow up with us within one week for BP check and a nephrology referral.

## 2017-10-10 NOTE — Telephone Encounter (Signed)
Left second message to call back.

## 2017-10-13 ENCOUNTER — Ambulatory Visit
Admission: RE | Admit: 2017-10-13 | Discharge: 2017-10-13 | Disposition: A | Discharge status: Home / Self Care | Payer: Medicare Other | Source: Ambulatory Visit | Attending: Family Medicine | Admitting: Family Medicine

## 2017-10-13 DIAGNOSIS — E2839 Other primary ovarian failure: Secondary | ICD-10-CM

## 2017-10-17 NOTE — Telephone Encounter (Signed)
Left message to call back  

## 2017-10-31 ENCOUNTER — Ambulatory Visit (INDEPENDENT_AMBULATORY_CARE_PROVIDER_SITE_OTHER): Payer: Medicare Other | Admitting: Pharmacist

## 2017-10-31 ENCOUNTER — Encounter: Payer: Self-pay | Admitting: Pharmacist

## 2017-10-31 VITALS — BP 116/72 | HR 64

## 2017-10-31 DIAGNOSIS — I6523 Occlusion and stenosis of bilateral carotid arteries: Secondary | ICD-10-CM | POA: Diagnosis not present

## 2017-10-31 DIAGNOSIS — I10 Essential (primary) hypertension: Secondary | ICD-10-CM

## 2017-10-31 NOTE — Telephone Encounter (Signed)
Notes recorded by Regis BillPratt, Casee Knepp B on 10/31/2017 at 4:28 PM EST Patient seen by Pharm D today, med changes made by PCP and follow up labs done with improvement. Blood pressure well controlled in blood pressure clinic

## 2017-10-31 NOTE — Progress Notes (Signed)
Patient ID: Ashley Sullivan                 DOB: 1951-07-27                      MRN: [id number]     HPI: Ashley Sullivan is a 67 y.o. female patient of Dr Duke Salvia, with a PMH significant for carotid stenosis, diabetes mellitus, hypertension, and morbid obesity.  During most recent office visit her medication was continued without changes ;unfortunately her repeat BMET showed acute renal problems with Scr increase form 1.72 on 1/4 to 2.69 on 1/17.  Per PCP recommendations metformin and losartan were discontinued and replaced by pioglitazone and HCTZ 25mg  daily. Repeat BMET on 10/17/2017 showed an improved Scr of 1.73 Patient presents today for HTN follow up and denies swelling, dizziness, headaches or shortness of breath.    Current HTN meds:  Amlodipine 10 mg daily (morning) HCTZ 25mg  daily (morning)  Previously tried: None  BP goal: <130/80 (Based on diabetes and hypertension guidelines)  Family History: Her mother had a heart attack at 67 and past away. She had heart failure previously. She does not know the cause of death of her father. Five of her siblings have diabetes and hypertension. One of her siblings just has hypertension.    Social History: STOPPED smoking ~ 2 months ago. Denies alcohol.  Diet: She is trying to follow a low sodium diet. She currently is not using salt when she cooks her food. She reports to not eating sausage, bacon, etc.  Exercise:  She walks occasionally with her daughter.  Home BP readings: none available for assessment  Wt Readings from Last 3 Encounters:  09/15/17 241 lb 9.6 oz (109.6 kg)  08/11/17 236 lb (107 kg)  12/19/13 211 lb (95.7 kg)   BP Readings from Last 3 Encounters:  10/31/17 116/72  09/28/17 130/72  09/15/17 (!) 150/68   Pulse Readings from Last 3 Encounters:  10/31/17 64  09/28/17 (!) 58  09/15/17 65    Past Medical History:  Diagnosis Date  . Carotid stenosis 09/15/2017  . Diabetes mellitus without complication (HCC)   . Essential  hypertension 09/15/2017  . Hypertension   . Morbid obesity (HCC) 09/15/2017    Current Outpatient Medications on File Prior to Visit  Medication Sig Dispense Refill  . amLODipine (NORVASC) 10 MG tablet Take 10 mg by mouth daily.    Marland Kitchen aspirin EC 81 MG tablet Take 81 mg by mouth daily.    . Dulaglutide (TRULICITY [place]) Inject into the skin.    . hydrochlorothiazide (HYDRODIURIL) 25 MG tablet Take 25 mg by mouth daily.    . Insulin Degludec (TRESIBA FLEXTOUCH ) Inject into the skin.    . pioglitazone (ACTOS) 15 MG tablet Take 15 mg by mouth daily.    . rosuvastatin (CRESTOR) 20 MG tablet Take 20 mg by mouth daily.     No current facility-administered medications on file prior to visit.     No Known Allergies  Blood pressure 116/72, pulse 64, SpO2 99 %.  Essential hypertension Blood pressure today is well controlled at 116/72. Patient stopped smoking about 2 months ago, and continues to work on lifestyle modifications. She denies problem with current therapy and her renal function is much improved since losartan and metformin were discontinue. Will continue current therapy without changes, and follow up with HTN clinic as needed.    Dary Dilauro Rodriguez-Guzman PharmD, BCPS, CPP [facility] [address]  [place] [phone] 10/31/2017 4:27 PM

## 2017-10-31 NOTE — Assessment & Plan Note (Signed)
Blood pressure today is well controlled at 116/72. Patient stopped smoking about 2 months ago, and continues to work on lifestyle modifications. She denies problem with current therapy and her renal function is much improved since losartan and metformin were discontinue. Will continue current therapy without changes, and follow up with HTN clinic as needed.

## 2017-10-31 NOTE — Patient Instructions (Addendum)
Return for a follow up appointment as needed  Check your blood pressure at home daily (if able) and keep record of the readings.  Take your BP meds as follows: *NO CHANGES*  Bring all of your meds, your BP cuff and your record of home blood pressures to your next appointment.  Exercise as you're able, try to walk approximately 30 minutes per day.  Keep salt intake to a minimum, especially watch canned and prepared boxed foods.  Eat more fresh fruits and vegetables and fewer canned items.  Avoid eating in fast food restaurants.    HOW TO TAKE YOUR BLOOD PRESSURE: . Rest 5 minutes before taking your blood pressure. .  Don't smoke or drink caffeinated beverages for at least 30 minutes before. . Take your blood pressure before (not after) you eat. . Sit comfortably with your back supported and both feet on the floor (don't cross your legs). . Elevate your arm to heart level on a table or a desk. . Use the proper sized cuff. It should fit smoothly and snugly around your bare upper arm. There should be enough room to slip a fingertip under the cuff. The bottom edge of the cuff should be 1 inch above the crease of the elbow. . Ideally, take 3 measurements at one sitting and record the average.    

## 2017-11-03 NOTE — Telephone Encounter (Signed)
Per Dr Duke Salviaandolph ok to allow PCP to follow kidney functions

## 2018-06-29 ENCOUNTER — Other Ambulatory Visit: Payer: Self-pay | Admitting: Nephrology

## 2018-06-29 DIAGNOSIS — N183 Chronic kidney disease, stage 3 unspecified: Secondary | ICD-10-CM

## 2018-06-29 DIAGNOSIS — I129 Hypertensive chronic kidney disease with stage 1 through stage 4 chronic kidney disease, or unspecified chronic kidney disease: Secondary | ICD-10-CM

## 2018-07-03 ENCOUNTER — Ambulatory Visit
Admission: RE | Admit: 2018-07-03 | Discharge: 2018-07-03 | Disposition: A | Discharge status: Home / Self Care | Payer: Medicare Other | Source: Ambulatory Visit | Attending: Nephrology | Admitting: Nephrology

## 2018-07-03 DIAGNOSIS — N183 Chronic kidney disease, stage 3 unspecified: Secondary | ICD-10-CM

## 2018-07-03 DIAGNOSIS — I129 Hypertensive chronic kidney disease with stage 1 through stage 4 chronic kidney disease, or unspecified chronic kidney disease: Secondary | ICD-10-CM

## 2018-09-27 ENCOUNTER — Ambulatory Visit (HOSPITAL_COMMUNITY)
Admission: RE | Admit: 2018-09-27 | Payer: Medicare Other | Source: Ambulatory Visit | Attending: Cardiovascular Disease | Admitting: Cardiovascular Disease

## 2018-12-22 ENCOUNTER — Emergency Department
Admission: EM | Admit: 2018-12-22 | Discharge: 2018-12-22 | Disposition: A | Discharge status: Home / Self Care | Payer: Medicare Other | Attending: Emergency Medicine | Admitting: Emergency Medicine

## 2018-12-22 ENCOUNTER — Other Ambulatory Visit: Payer: Self-pay

## 2018-12-22 ENCOUNTER — Encounter: Payer: Self-pay | Admitting: Emergency Medicine

## 2018-12-22 ENCOUNTER — Emergency Department: Payer: Medicare Other

## 2018-12-22 DIAGNOSIS — I10 Essential (primary) hypertension: Secondary | ICD-10-CM | POA: Diagnosis not present

## 2018-12-22 DIAGNOSIS — Z87891 Personal history of nicotine dependence: Secondary | ICD-10-CM | POA: Diagnosis not present

## 2018-12-22 DIAGNOSIS — E119 Type 2 diabetes mellitus without complications: Secondary | ICD-10-CM | POA: Insufficient documentation

## 2018-12-22 DIAGNOSIS — K209 Esophagitis, unspecified without bleeding: Secondary | ICD-10-CM

## 2018-12-22 DIAGNOSIS — Z79899 Other long term (current) drug therapy: Secondary | ICD-10-CM | POA: Insufficient documentation

## 2018-12-22 DIAGNOSIS — K2970 Gastritis, unspecified, without bleeding: Secondary | ICD-10-CM | POA: Diagnosis not present

## 2018-12-22 DIAGNOSIS — R079 Chest pain, unspecified: Secondary | ICD-10-CM | POA: Diagnosis present

## 2018-12-22 LAB — COMPREHENSIVE METABOLIC PANEL
ALT: 30 U/L (ref 0–44)
AST: 34 U/L (ref 15–41)
Albumin: 3.6 g/dL (ref 3.5–5.0)
Alkaline Phosphatase: 61 U/L (ref 38–126)
Anion gap: 11 (ref 5–15)
BUN: 39 mg/dL — ABNORMAL HIGH (ref 8–23)
CO2: 26 mmol/L (ref 22–32)
Calcium: 9.8 mg/dL (ref 8.9–10.3)
Chloride: 102 mmol/L (ref 98–111)
Creatinine, Ser: 3.42 mg/dL — ABNORMAL HIGH (ref 0.44–1.00)
GFR calc Af Amer: 15 mL/min — ABNORMAL LOW (ref >60)
GFR calc non Af Amer: 13 mL/min — ABNORMAL LOW (ref >60)
Glucose, Bld: 148 mg/dL — ABNORMAL HIGH (ref 70–99)
Potassium: 3 mmol/L — ABNORMAL LOW (ref 3.5–5.1)
Sodium: 139 mmol/L (ref 135–145)
Total Bilirubin: 0.7 mg/dL (ref 0.3–1.2)
Total Protein: 7.6 g/dL (ref 6.5–8.1)

## 2018-12-22 LAB — CBC WITH DIFFERENTIAL/PLATELET
Abs Immature Granulocytes: 0.01 10*3/uL (ref 0.00–0.07)
Basophils Absolute: 0 10*3/uL (ref 0.0–0.1)
Basophils Relative: 0 %
Eosinophils Absolute: 0.1 10*3/uL (ref 0.0–0.5)
Eosinophils Relative: 1 %
HCT: 41.1 % (ref 36.0–46.0)
Hemoglobin: 12.8 g/dL (ref 12.0–15.0)
Immature Granulocytes: 0 %
Lymphocytes Relative: 28 %
Lymphs Abs: 2.1 10*3/uL (ref 0.7–4.0)
MCH: 25.5 pg — ABNORMAL LOW (ref 26.0–34.0)
MCHC: 31.1 g/dL (ref 30.0–36.0)
MCV: 81.9 fL (ref 80.0–100.0)
Monocytes Absolute: 0.8 10*3/uL (ref 0.1–1.0)
Monocytes Relative: 10 %
Neutro Abs: 4.5 10*3/uL (ref 1.7–7.7)
Neutrophils Relative %: 61 %
Platelets: 285 10*3/uL (ref 150–400)
RBC: 5.02 MIL/uL (ref 3.87–5.11)
RDW: 15.2 % (ref 11.5–15.5)
WBC: 7.5 10*3/uL (ref 4.0–10.5)
nRBC: 0 % (ref 0.0–0.2)

## 2018-12-22 LAB — TROPONIN I: Troponin I: <0.03 ng/mL (ref <0.03)

## 2018-12-22 LAB — LIPASE, BLOOD: Lipase: 37 U/L (ref 11–51)

## 2018-12-22 MED ORDER — LIDOCAINE VISCOUS HCL 2 % MT SOLN
15.0000 mL | Freq: Once | OROMUCOSAL | Status: AC
  Administered 2018-12-22: 15 mL via OROMUCOSAL
  Filled 2018-12-22: qty 15

## 2018-12-22 MED ORDER — ONDANSETRON 4 MG PO TBDP
4.0000 mg | ORAL_TABLET | Freq: Once | ORAL | Status: AC
  Administered 2018-12-22: 4 mg via ORAL
  Filled 2018-12-22: qty 1

## 2018-12-22 MED ORDER — PANTOPRAZOLE SODIUM 20 MG PO TBEC: 20.0000 mg | DELAYED_RELEASE_TABLET | Freq: Every day | ORAL | 1 refills | Status: DC

## 2018-12-22 MED ORDER — ALUM & MAG HYDROXIDE-SIMETH 200-200-20 MG/5ML PO SUSP
30.0000 mL | Freq: Once | ORAL | Status: AC
  Administered 2018-12-22: 30 mL via ORAL
  Filled 2018-12-22: qty 30

## 2018-12-22 MED ORDER — ONDANSETRON 4 MG PO TBDP: 4.0000 mg | ORAL_TABLET | Freq: Three times a day (TID) | ORAL | 0 refills | Status: DC | PRN

## 2018-12-22 MED ORDER — LIDOCAINE VISCOUS HCL 2 % MT SOLN
15.0000 mL | Freq: Once | OROMUCOSAL | Status: AC
  Administered 2018-12-22: 15 mL via ORAL
  Filled 2018-12-22: qty 15

## 2018-12-22 NOTE — ED Provider Notes (Signed)
[facility] Emergency Department Provider Note   ____________________________________________    I have reviewed the triage vital signs and the nursing notes.   HISTORY  Chief Complaint Heartburn and Emesis     HPI Ashley Sullivan is a 69 y.o. female who presents with complaints of heartburn/chest discomfort.  Patient reports her husband has been in the hospital and she has been quite stressed.  Yesterday she was feeling nauseated which she attributes to stress.  She vomited several times.  Since that time she has had a burning sensation in her central chest.  She denies chest pressure.  No radiation of pain.  No back pain.  No fevers or chills or cough.  No shortness of breath.  No calf pain or swelling.  Has not taken anything for this.  Past Medical History:  Diagnosis Date  . Carotid stenosis 09/15/2017  . Diabetes mellitus without complication (HCC)   . Essential hypertension 09/15/2017  . Hypertension   . Morbid obesity (HCC) 09/15/2017    Patient Active Problem List   Diagnosis Date Noted  . Carotid stenosis 09/15/2017  . Essential hypertension 09/15/2017  . Morbid obesity (HCC) 09/15/2017  . Diabetic retinopathy (HCC) 06/01/2016  . Hyperlipidemia 06/01/2016  . Type 2 diabetes mellitus (HCC) 06/01/2016    Past Surgical History:  Procedure Laterality Date  . TONSILLECTOMY      Prior to Admission medications   Medication Sig Start Date End Date Taking? Authorizing Provider  amLODipine (NORVASC) 10 MG tablet Take 10 mg by mouth daily.    [provider]  aspirin EC 81 MG tablet Take 81 mg by mouth daily.    [provider]  Dulaglutide (TRULICITY [place]) Inject into the skin.    [provider]  hydrochlorothiazide (HYDRODIURIL) 25 MG tablet Take 25 mg by mouth daily.    [provider]  Insulin Degludec (TRESIBA FLEXTOUCH [place]) Inject into the skin.    [provider]  ondansetron (ZOFRAN ODT) 4 MG  disintegrating tablet Take 1 tablet (4 mg total) by mouth every 8 (eight) hours as needed. 12/22/18   Jene Every, MD  pantoprazole (PROTONIX) 20 MG tablet Take 1 tablet (20 mg total) by mouth daily. 12/22/18 12/22/19  Jene Every, MD  pioglitazone (ACTOS) 15 MG tablet Take 15 mg by mouth daily.    [provider]  rosuvastatin (CRESTOR) 20 MG tablet Take 20 mg by mouth daily.    [provider]     Allergies Patient has no known allergies.  Family History  Problem Relation Age of Onset  . Diabetes Mother   . Heart failure Mother   . Heart attack Mother   . Diabetes Sister   . Diabetes Sister   . Diabetes Sister     Social History Social History   Tobacco Use  . Smoking status: Former Smoker    Types: Cigarettes    Last attempt to quit: 09/12/2017    Years since quitting: 1.2  . Smokeless tobacco: Never Used  Substance Use Topics  . Alcohol use: No    Frequency: Never  . Drug use: No    Review of Systems  Constitutional: No fever/chills Eyes: No visual changes.  ENT: No sore throat. Cardiovascular: As above Respiratory: Denies shortness of breath. Gastrointestinal: No abdominal pain.  As above Genitourinary: Negative for dysuria. Musculoskeletal: Negative for back pain. Skin: Negative for rash. Neurological: Negative for headaches    ____________________________________________   PHYSICAL EXAM:  VITAL SIGNS: ED  Triage Vitals  Enc Vitals Group     BP 12/22/18 0813 (!) 157/63     Pulse Rate 12/22/18 0813 78     Resp 12/22/18 0813 16     Temp 12/22/18 0813 98.1 F (36.7 C)     Temp Source 12/22/18 0813 Oral     SpO2 12/22/18 0813 99 %     Weight 12/22/18 0811 104.3 kg (230 lb)     Height 12/22/18 0811 1.651 m (5\' 5" )     Head Circumference --      Peak Flow --      Pain Score 12/22/18 0810 0     Pain Loc --      Pain Edu? --      Excl. in GC? --     Constitutional: Alert and oriented. No acute distress.  Nose: No  congestion/rhinnorhea. Mouth/Throat: Mucous membranes are moist.    Cardiovascular: Normal rate, regular rhythm. Grossly normal heart sounds.  Good peripheral circulation. Respiratory: Normal respiratory effort.  No retractions. Lungs CTAB. Gastrointestinal: Soft and nontender. No distention.    Musculoskeletal: No lower extremity tenderness nor edema.  Warm and well perfused Neurologic:  Normal speech and language. No gross focal neurologic deficits are appreciated.  Skin:  Skin is warm, dry and intact. No rash noted. Psychiatric: Mood and affect are normal. Speech and behavior are normal.  ____________________________________________   LABS (all labs ordered are listed, but only abnormal results are displayed)  Labs Reviewed  CBC WITH DIFFERENTIAL/PLATELET - Abnormal; Notable for the following components:      Result Value   MCH 25.5 (*)    All other components within normal limits  COMPREHENSIVE METABOLIC PANEL - Abnormal; Notable for the following components:   Potassium 3.0 (*)    Glucose, Bld 148 (*)    BUN 39 (*)    Creatinine, Ser 3.42 (*)    GFR calc non Af Amer 13 (*)    GFR calc Af Amer 15 (*)    All other components within normal limits  TROPONIN I  LIPASE, BLOOD   ____________________________________________  EKG  ED ECG REPORT I, Jene Every, the attending physician, personally viewed and interpreted this ECG.  Date: 12/22/2018  Rhythm: normal sinus rhythm QRS Axis: normal Intervals: normal ST/T Wave abnormalities: normal Narrative Interpretation: no evidence of acute ischemia  ____________________________________________  RADIOLOGY  None ____________________________________________   PROCEDURES  Procedure(s) performed: No  Procedures   Critical Care performed: No ____________________________________________   INITIAL IMPRESSION / ASSESSMENT AND PLAN / ED COURSE  Pertinent labs & imaging results that were available during my care of  the patient were reviewed by me and considered in my medical decision making (see chart for details).  Patient presents with complaints of burning chest discomfort.  Differential certainly includes heartburn/esophagitis related to vomiting, less likely ACS, pericarditis, not consistent with dissection.  Given her age and risk factors we will send labs including EKG   EKG is overall reassuring.  Will treat with p.o. Zofran and trial GI cocktail  Patient had significant improvement after GI cocktail consistent with esophagitis/gastritis.  Lab work is greatly reassuring.  She has chronic kidney disease for which she sees nephrology, her creatinine today is not significantly changed from her prior.  Given that she is feeling better, has no abdominal pain and reassuring exam with reassuring labs will discharge with p.o. Zofran and Protonix.  Strict return precautions discussed.  Outpatient follow-up as needed    ____________________________________________   FINAL CLINICAL  IMPRESSION(S) / ED DIAGNOSES  Final diagnoses:  Esophagitis with gastritis        Note:  This document was prepared using Dragon voice recognition software and may include unintentional dictation errors.   Jene EveryKinner, Harshaan Whang, MD 12/22/18 1334

## 2018-12-22 NOTE — ED Notes (Signed)
AAOx3.  Skin warm and dry.  MM Moist.  Patient describes being under stress lately, due to husband being hospitalized for the past 6 weeks.  States she has been anxious and had intermittent nausea with some emesis this morning.  Emotional support given, well received.

## 2018-12-22 NOTE — ED Notes (Signed)
AAOx3.  Skin warm and dry.  Sitting up at bedside. Tolerating ginger ale.  NAD

## 2018-12-22 NOTE — ED Triage Notes (Signed)
Pt to ED via POV c/o heartburn and emesis since yesterday. Pt states that she has vomited 4-5 times in the last 24 hours. Pt denies hx/o GERD, pt has used OTC medication with mild relief. Pt is in NAD at this time.

## 2018-12-22 NOTE — ED Notes (Signed)
AAOx3.  Skin warm and dry.  NAD 

## 2018-12-22 NOTE — ED Notes (Signed)
Patient denies pain and is resting comfortably.  

## 2018-12-31 ENCOUNTER — Telehealth: Payer: Self-pay | Admitting: *Deleted

## 2018-12-31 NOTE — Telephone Encounter (Signed)
Mrs.Zumbro, refused appointment at this time.She wants to wait later.Deferred recall.

## 2019-06-19 ENCOUNTER — Encounter: Payer: Self-pay | Admitting: Gynecology

## 2020-05-01 IMAGING — US US RENAL
1 series · 14 of 25 positions shown · non-contrast
Comparison: None.

CLINICAL DATA: Chronic kidney disease, stage III

EXAM:
RENAL / URINARY TRACT ULTRASOUND COMPLETE

[Series 1: us renal · 0.27mm/px · 14 of 33 slices shown]
[im 1/33]
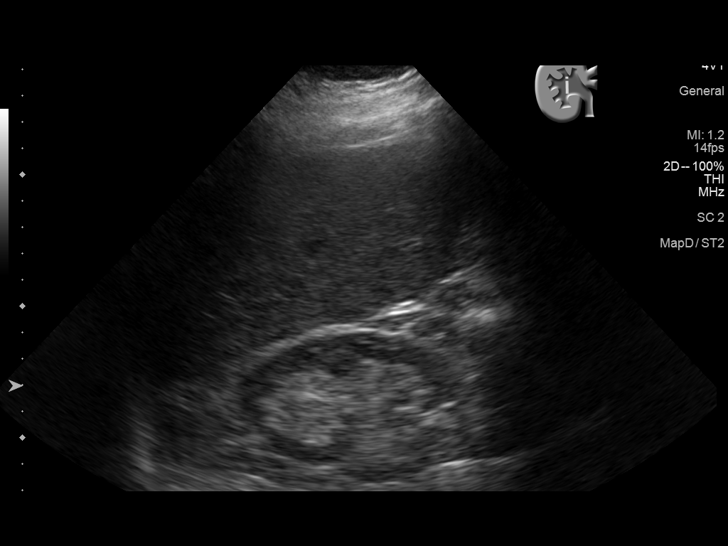
[im 3/33]
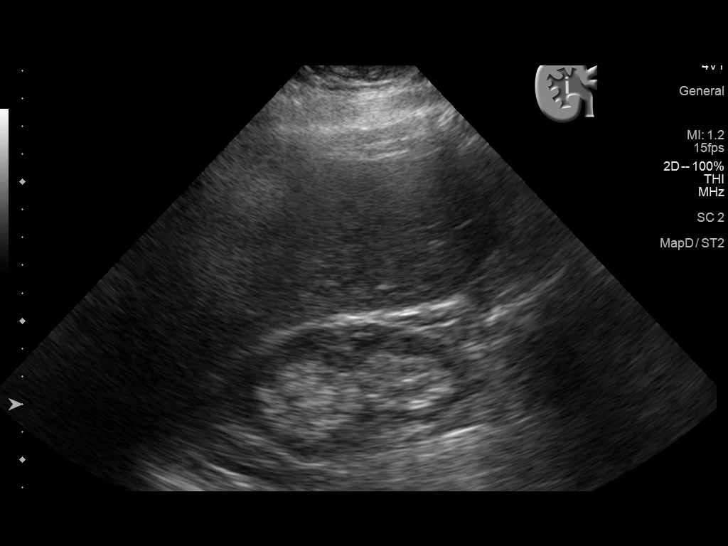
[im 6/33]
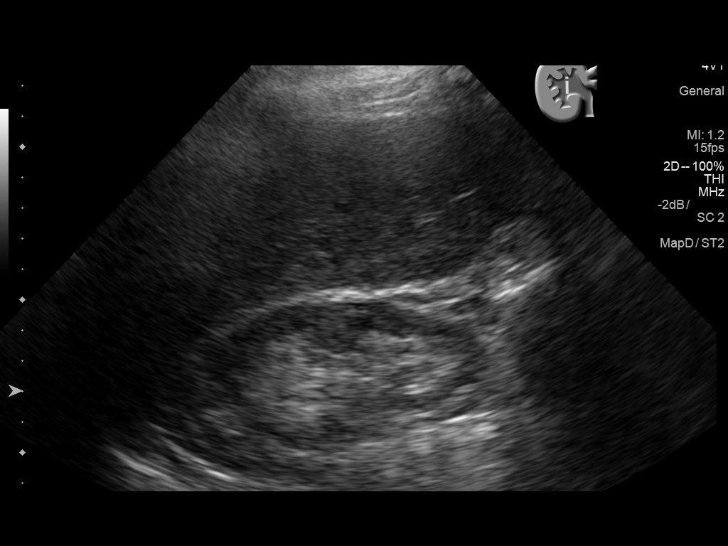
[im 9/33]
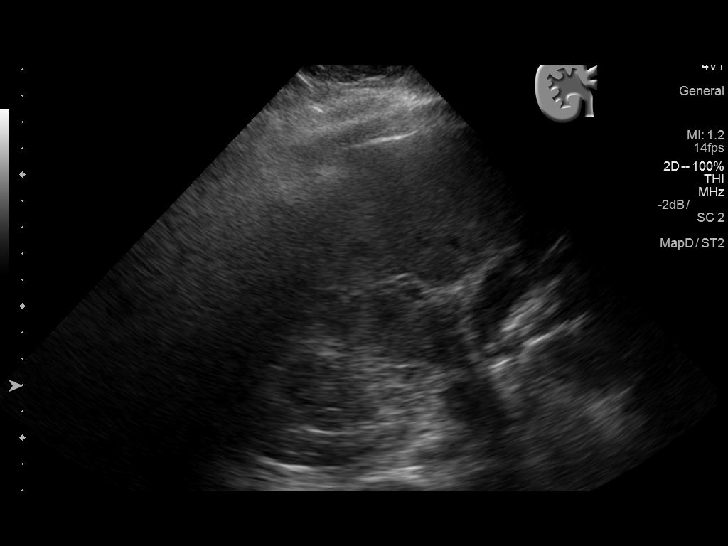
[im 11/33]
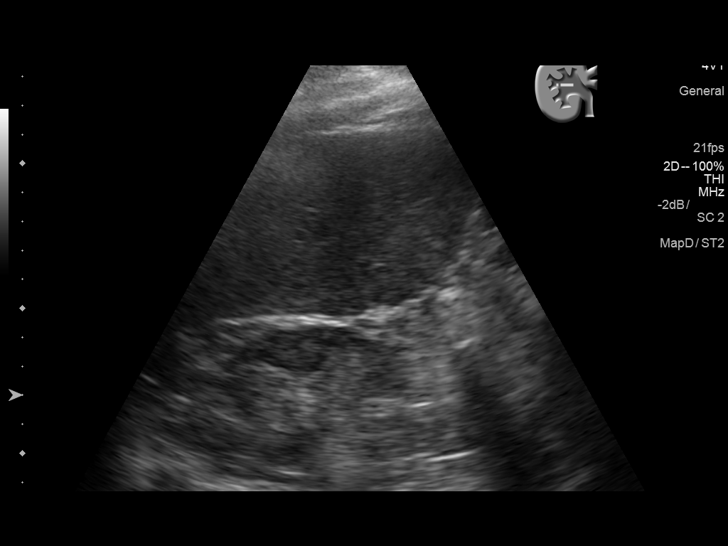
[im 13/33]
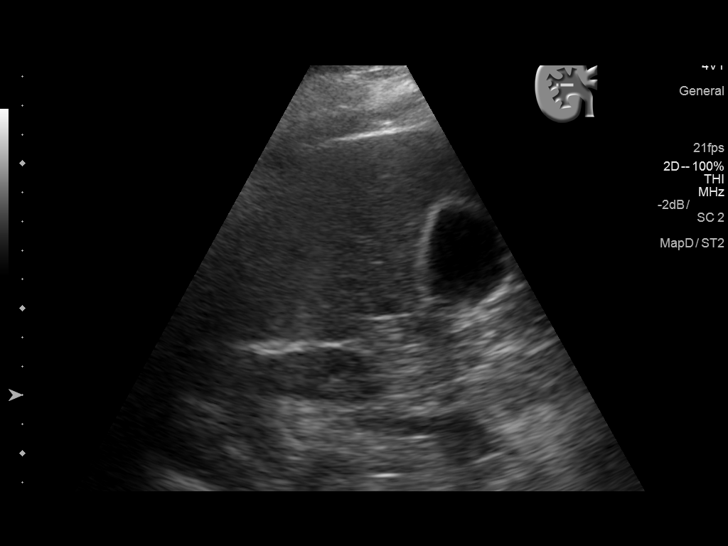
[im 15/33]
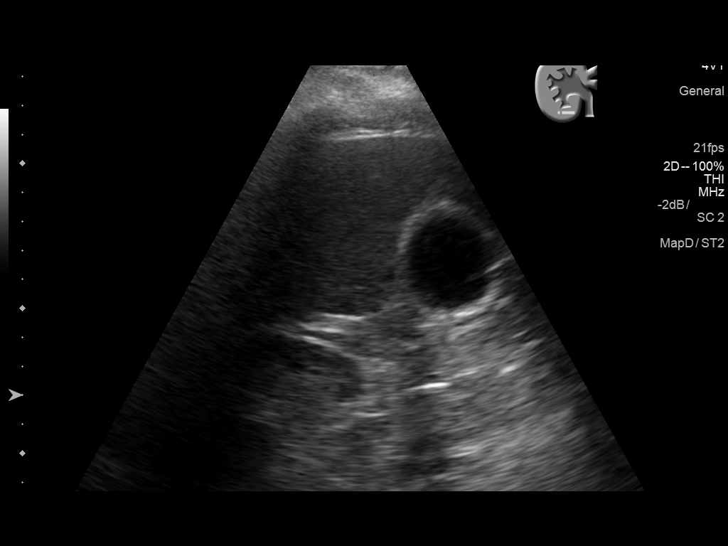
[im 18/33]
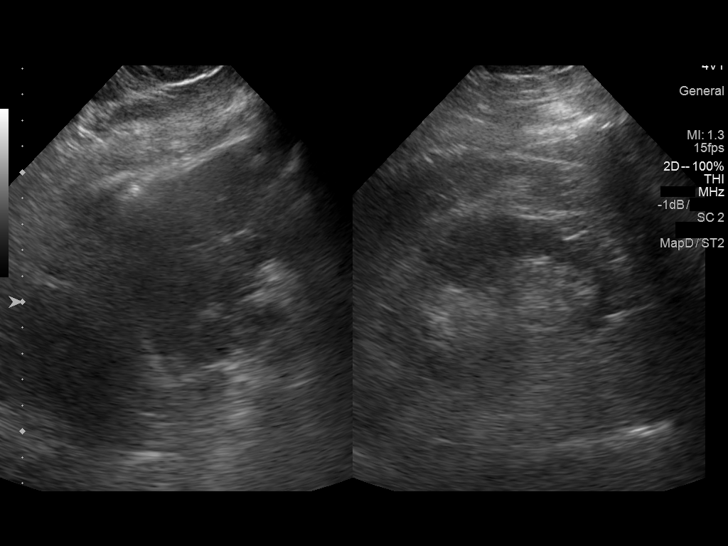
[im 21/33]
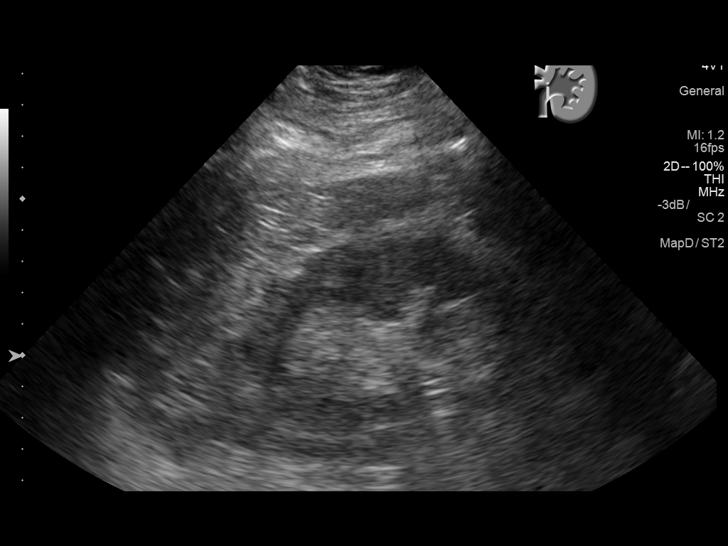
[im 22/33]
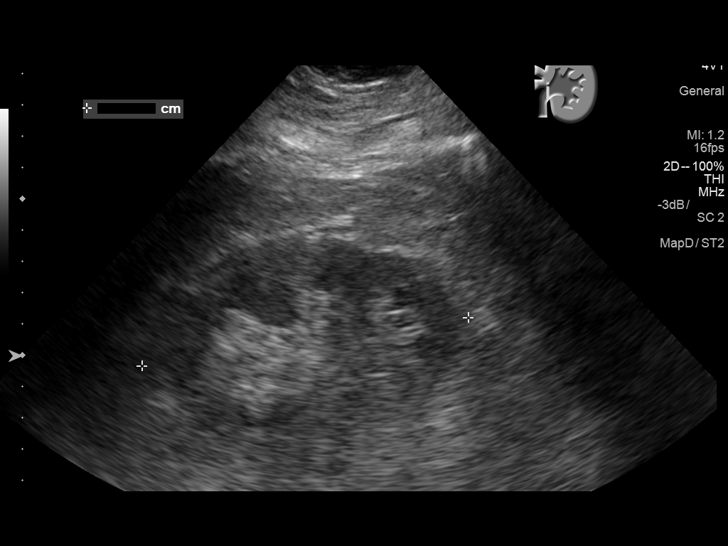
[im 25/33]
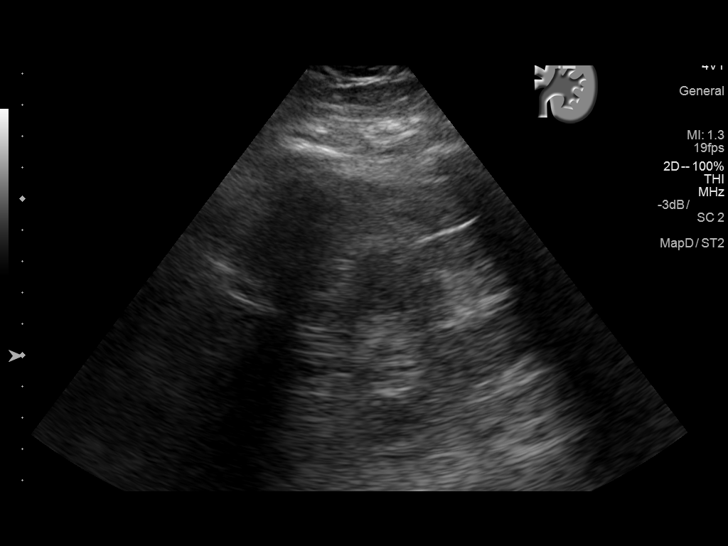
[im 27/33]
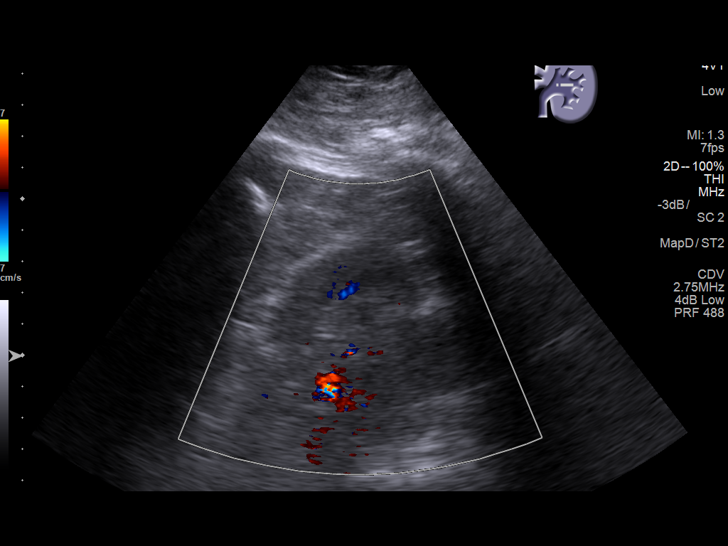
[im 30/33]
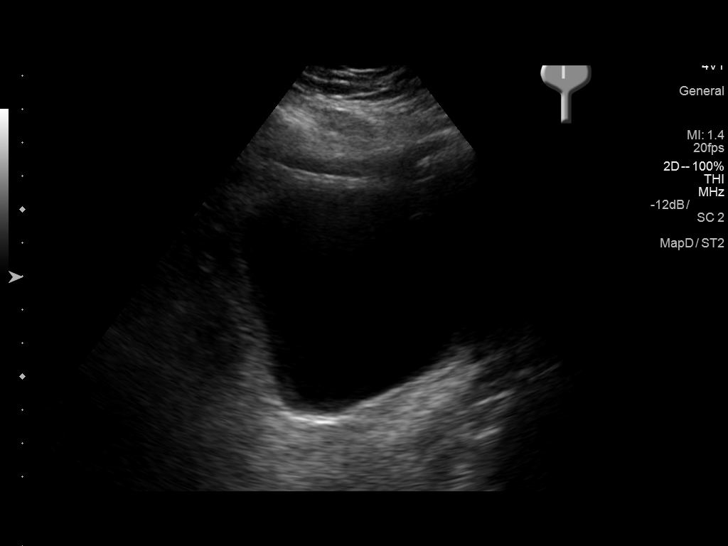
[im 33/33]
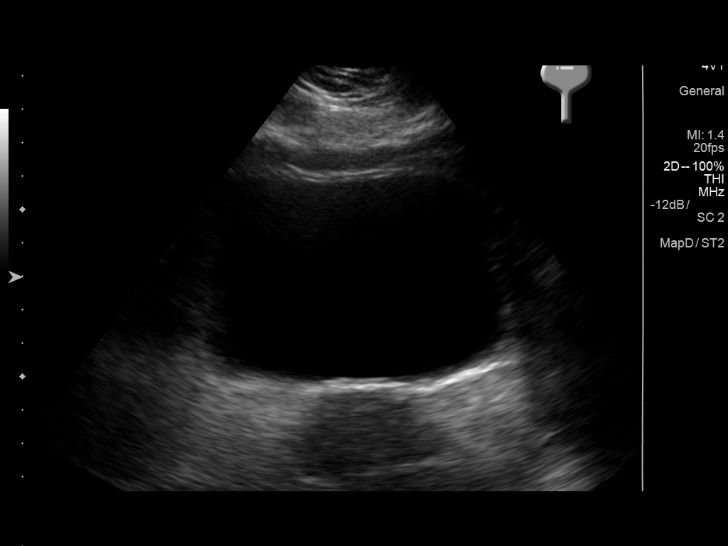

[14 of 25 positions shown; findings below may reference images not displayed]

FINDINGS: Right Kidney:

Length: 9.2 cm. Renal cortical thinning. Echogenicity within normal
limits. No mass or hydronephrosis visualized.

Left Kidney:

Length: 10.6 cm. Renal cortical thinning. Echogenicity within normal
limits. No mass or hydronephrosis visualized.

Bladder:

Appears normal for degree of bladder distention.
IMPRESSION: 1. Bilateral renal cortical thinning as can be seen with medical
renal disease.
2. No obstructive uropathy.

## 2020-07-20 IMAGING — DX PORTABLE CHEST - 1 VIEW
1 series · 1 of 1 positions shown · non-contrast
Comparison: 12/19/2013 chest radiograph.

CLINICAL DATA: Chest burning, vomiting

EXAM:
PORTABLE CHEST 1 VIEW

[chest ap]
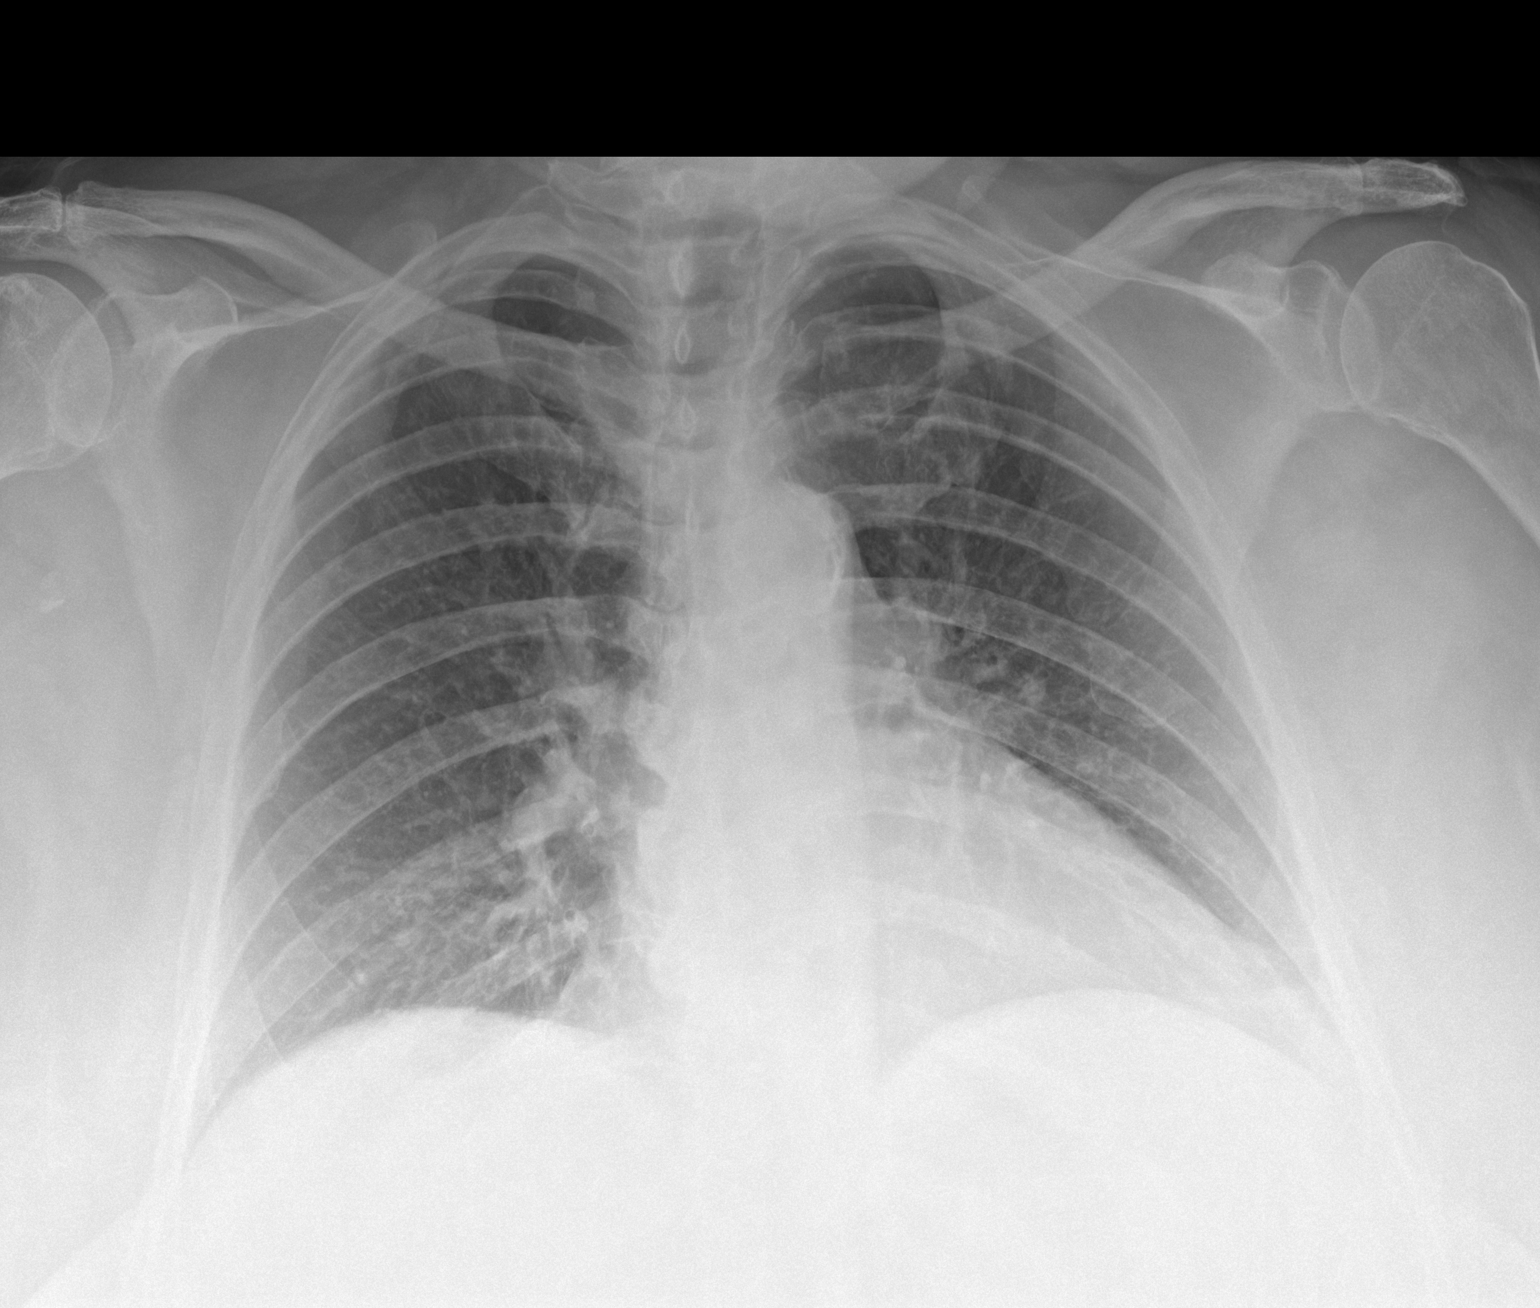

[1 of 1 positions shown; findings below may reference images not displayed]

FINDINGS: Stable cardiomediastinal silhouette with normal heart size. No
pneumothorax. No pleural effusion. Minimal scarring versus
atelectasis at the left costophrenic angle. No pulmonary edema. No
acute consolidative airspace disease.
IMPRESSION: Minimal scarring versus atelectasis at the left costophrenic angle.
Otherwise no active disease.

## 2022-05-06 ENCOUNTER — Encounter (HOSPITAL_BASED_OUTPATIENT_CLINIC_OR_DEPARTMENT_OTHER): Payer: Self-pay

## 2023-11-28 DIAGNOSIS — E119 Type 2 diabetes mellitus without complications: Secondary | ICD-10-CM | POA: Diagnosis not present

## 2023-11-28 DIAGNOSIS — N184 Chronic kidney disease, stage 4 (severe): Secondary | ICD-10-CM | POA: Diagnosis not present

## 2023-12-20 DIAGNOSIS — Z1231 Encounter for screening mammogram for malignant neoplasm of breast: Secondary | ICD-10-CM | POA: Diagnosis not present

## 2024-02-23 DIAGNOSIS — Z0001 Encounter for general adult medical examination with abnormal findings: Secondary | ICD-10-CM | POA: Diagnosis not present

## 2024-02-23 DIAGNOSIS — E7800 Pure hypercholesterolemia, unspecified: Secondary | ICD-10-CM | POA: Diagnosis not present

## 2024-02-23 DIAGNOSIS — H6122 Impacted cerumen, left ear: Secondary | ICD-10-CM | POA: Diagnosis not present

## 2024-02-23 DIAGNOSIS — N184 Chronic kidney disease, stage 4 (severe): Secondary | ICD-10-CM | POA: Diagnosis not present

## 2024-02-23 DIAGNOSIS — E113399 Type 2 diabetes mellitus with moderate nonproliferative diabetic retinopathy without macular edema, unspecified eye: Secondary | ICD-10-CM | POA: Diagnosis not present

## 2024-02-23 DIAGNOSIS — Z79899 Other long term (current) drug therapy: Secondary | ICD-10-CM | POA: Diagnosis not present

## 2024-02-23 DIAGNOSIS — E1122 Type 2 diabetes mellitus with diabetic chronic kidney disease: Secondary | ICD-10-CM | POA: Diagnosis not present

## 2024-02-23 DIAGNOSIS — E1169 Type 2 diabetes mellitus with other specified complication: Secondary | ICD-10-CM | POA: Diagnosis not present

## 2024-02-23 DIAGNOSIS — Z6839 Body mass index (BMI) 39.0-39.9, adult: Secondary | ICD-10-CM | POA: Diagnosis not present

## 2024-02-28 DIAGNOSIS — E119 Type 2 diabetes mellitus without complications: Secondary | ICD-10-CM | POA: Diagnosis not present

## 2024-02-28 DIAGNOSIS — N184 Chronic kidney disease, stage 4 (severe): Secondary | ICD-10-CM | POA: Diagnosis not present

## 2024-02-28 DIAGNOSIS — E113399 Type 2 diabetes mellitus with moderate nonproliferative diabetic retinopathy without macular edema, unspecified eye: Secondary | ICD-10-CM | POA: Diagnosis not present

## 2024-02-28 DIAGNOSIS — E1122 Type 2 diabetes mellitus with diabetic chronic kidney disease: Secondary | ICD-10-CM | POA: Diagnosis not present

## 2024-03-11 DIAGNOSIS — N184 Chronic kidney disease, stage 4 (severe): Secondary | ICD-10-CM | POA: Diagnosis not present

## 2024-03-11 DIAGNOSIS — E1169 Type 2 diabetes mellitus with other specified complication: Secondary | ICD-10-CM | POA: Diagnosis not present

## 2024-03-11 DIAGNOSIS — E1122 Type 2 diabetes mellitus with diabetic chronic kidney disease: Secondary | ICD-10-CM | POA: Diagnosis not present

## 2024-04-06 DIAGNOSIS — E113399 Type 2 diabetes mellitus with moderate nonproliferative diabetic retinopathy without macular edema, unspecified eye: Secondary | ICD-10-CM | POA: Diagnosis not present

## 2024-04-06 DIAGNOSIS — E1122 Type 2 diabetes mellitus with diabetic chronic kidney disease: Secondary | ICD-10-CM | POA: Diagnosis not present

## 2024-04-06 DIAGNOSIS — E119 Type 2 diabetes mellitus without complications: Secondary | ICD-10-CM | POA: Diagnosis not present

## 2024-04-06 DIAGNOSIS — N184 Chronic kidney disease, stage 4 (severe): Secondary | ICD-10-CM | POA: Diagnosis not present

## 2024-04-11 DIAGNOSIS — E119 Type 2 diabetes mellitus without complications: Secondary | ICD-10-CM | POA: Diagnosis not present

## 2024-04-11 DIAGNOSIS — N184 Chronic kidney disease, stage 4 (severe): Secondary | ICD-10-CM | POA: Diagnosis not present

## 2024-04-11 DIAGNOSIS — E1169 Type 2 diabetes mellitus with other specified complication: Secondary | ICD-10-CM | POA: Diagnosis not present

## 2024-04-11 DIAGNOSIS — E1122 Type 2 diabetes mellitus with diabetic chronic kidney disease: Secondary | ICD-10-CM | POA: Diagnosis not present

## 2024-04-11 DIAGNOSIS — E113399 Type 2 diabetes mellitus with moderate nonproliferative diabetic retinopathy without macular edema, unspecified eye: Secondary | ICD-10-CM | POA: Diagnosis not present

## 2024-05-06 DIAGNOSIS — M79605 Pain in left leg: Secondary | ICD-10-CM | POA: Diagnosis not present

## 2024-05-06 DIAGNOSIS — N184 Chronic kidney disease, stage 4 (severe): Secondary | ICD-10-CM | POA: Diagnosis not present

## 2024-05-06 DIAGNOSIS — E1122 Type 2 diabetes mellitus with diabetic chronic kidney disease: Secondary | ICD-10-CM | POA: Diagnosis not present

## 2024-05-06 DIAGNOSIS — E113399 Type 2 diabetes mellitus with moderate nonproliferative diabetic retinopathy without macular edema, unspecified eye: Secondary | ICD-10-CM | POA: Diagnosis not present

## 2024-05-06 DIAGNOSIS — E119 Type 2 diabetes mellitus without complications: Secondary | ICD-10-CM | POA: Diagnosis not present

## 2024-05-07 ENCOUNTER — Other Ambulatory Visit (HOSPITAL_COMMUNITY): Payer: Self-pay | Admitting: Family Medicine

## 2024-05-07 ENCOUNTER — Other Ambulatory Visit (HOSPITAL_COMMUNITY): Payer: Self-pay

## 2024-05-07 ENCOUNTER — Telehealth: Payer: Self-pay

## 2024-05-07 ENCOUNTER — Ambulatory Visit (HOSPITAL_COMMUNITY)
Admission: RE | Admit: 2024-05-07 | Discharge: 2024-05-07 | Disposition: A | Discharge status: Home / Self Care | Payer: Medicare (Managed Care) | Source: Ambulatory Visit | Attending: Vascular Surgery | Admitting: Vascular Surgery

## 2024-05-07 ENCOUNTER — Ambulatory Visit: Payer: Medicare (Managed Care) | Attending: Vascular Surgery | Admitting: Pharmacist

## 2024-05-07 VITALS — BP 143/64 | HR 53 | Wt 230.6 lb

## 2024-05-07 DIAGNOSIS — I82412 Acute embolism and thrombosis of left femoral vein: Secondary | ICD-10-CM | POA: Diagnosis not present

## 2024-05-07 DIAGNOSIS — M7989 Other specified soft tissue disorders: Secondary | ICD-10-CM

## 2024-05-07 DIAGNOSIS — R936 Abnormal findings on diagnostic imaging of limbs: Secondary | ICD-10-CM | POA: Insufficient documentation

## 2024-05-07 DIAGNOSIS — R52 Pain, unspecified: Secondary | ICD-10-CM

## 2024-05-07 MED ORDER — APIXABAN 5 MG PO TABS: 5.0000 mg | ORAL_TABLET | Freq: Two times a day (BID) | ORAL | 1 refills | Status: AC

## 2024-05-07 MED ORDER — ELIQUIS DVT/PE STARTER PACK 5 MG PO TBPK
ORAL_TABLET | ORAL | 0 refills | Status: AC
  Filled 2024-05-07: qty 74, 30d supply, fill #0

## 2024-05-07 NOTE — Progress Notes (Signed)
 DVT Clinic Note  Name: Ashley Sullivan     MRN: [id number]     DOB: [birth year]     Sex: female  PCP: Eliodoro Dawna FALCON, FNP  Today's Visit: Visit Information: Initial Visit  Referred to DVT Clinic by: Primary Care - Dibas Koirala, MD Referred to CPP by: Dr. Gretta Reason for referral:  Chief Complaint  Patient presents with   DVT   HISTORY OF PRESENT ILLNESS: Ashley Sullivan is a 73 y.o. female with PMH carotid stenosis, type 2 diabetes, obesity, HTN, HLD, CKD who presents after diagnosis of DVT for medication management. Patient was seen by her PCP on 05/06/24 and reported swelling and pain in her left leg. DVT study completed today showed acute DVT in the left common femoral, SF junction, femoral, popliteal, posterior tibial, and peroneal veins. She was referred to DVT clinic to start treatment. Patient reports swelling in her left leg began about 3 weeks ago. Initially she thought it was related to the shoes she was wearing, but her symptoms did not improve so she sought treatment. Her swelling extends from her foot to her thigh. Endorses mild soreness mainly in her calf. Denies SOB and chest pain. PCP gave her an Eliquis  sample and she took 2 tablets (10 mg) last night, but has not taken any more Eliquis  today. She started a new supplement about 3-4 weeks ago that she ordered online to help with energy, but is unable to recall the name or ingredients. Also currently taking Bayer rapid relief powder that contains aspirin 650 mg and caffeine as needed for pain. She is retired, but stays active with social activities. Denies personal or family history of blood clots.   Positive Thrombotic Risk Factors: Older Age, Obesity Bleeding Risk Factors: Age >65 years  Negative Thrombotic Risk Factors: Previous VTE, Recent surgery (within 3 months), Recent trauma (within 3 months), Recent admission to hospital with acute illness (within 3 months), Paralysis, paresis, or recent plaster cast immobilization of lower  extremity, Central venous catheterization, Pregnancy, Sedentary journey lasting >8 hours within 4 weeks, Bed rest >72 hours within 3 months, Within 6 weeks postpartum, Recent cesarean section (within 3 months), Estrogen therapy, Recent COVID diagnosis (within 3 months), Erythropoiesis-stimulating agent, Testosterone therapy, Non-malignant, chronic inflammatory condition, Active cancer, Known thrombophilic condition, Smoking  Rx Insurance Coverage: Medicare Tricare Rx Affordability: Eliquis  is $0 for 90 days Rx Assistance Provided: N/A Preferred Pharmacy: Eliquis  starter pack filled at [facility] during her appointment. Refills sent to [facility] per patient preference.  Past Medical History:  Diagnosis Date   Carotid stenosis 09/15/2017   Diabetes mellitus without complication (HCC)    Essential hypertension 09/15/2017   Hypertension    Morbid obesity (HCC) 09/15/2017    Past Surgical History:  Procedure Laterality Date   TONSILLECTOMY      Social History   Socioeconomic History   Marital status: Married    Spouse name: Not on file   Number of children: Not on file   Years of education: Not on file   Highest education level: Not on file  Occupational History   Not on file  Tobacco Use   Smoking status: Former    Current packs/day: 0.00    Types: Cigarettes    Quit date: 09/12/2017    Years since quitting: 6.6   Smokeless tobacco: Never  Vaping Use   Vaping status: Never Used  Substance and Sexual Activity   Alcohol use: No   Drug use: No   Sexual activity:  Yes    Comment: 1st intercourse 20 yo-5 partners  Other Topics Concern   Not on file  Social History Narrative   Not on file   Social Drivers of Health   Financial Resource Strain: Not on file  Food Insecurity: Not on file  Transportation Needs: Not on file  Physical Activity: Not on file  Stress: Not on file  Social Connections: Not on file  Intimate Partner Violence: Not on file    Family History   Problem Relation Age of Onset   Diabetes Mother    Heart failure Mother    Heart attack Mother    Diabetes Sister    Diabetes Sister    Diabetes Sister     Allergies as of 05/07/2024   (No Known Allergies)    Current Outpatient Medications on File Prior to Visit  Medication Sig Dispense Refill   ALPHAGAN P 0.1 % SOLN Instill 1 drop every day by ophthalmic route for 60 days.     amLODipine (NORVASC) 10 MG tablet Take 10 mg by mouth daily.     Cholecalciferol 50 MCG (2000 UT) CAPS Take 1 capsule by mouth daily.     hydrochlorothiazide (HYDRODIURIL) 25 MG tablet Take 25 mg by mouth daily.     Insulin Degludec (TRESIBA FLEXTOUCH [place]) Inject into the skin.     JARDIANCE 10 MG TABS tablet Take 10 mg by mouth daily.     lisinopril (ZESTRIL) 10 MG tablet Take 10 mg by mouth daily.     MOUNJARO 5 MG/0.5ML Pen Inject 5 mg into the skin once a week.     pioglitazone (ACTOS) 15 MG tablet Take 15 mg by mouth daily.     rosuvastatin (CRESTOR) 20 MG tablet Take 20 mg by mouth daily.     No current facility-administered medications on file prior to visit.   REVIEW OF SYSTEMS:  Review of Systems  Respiratory:  Negative for shortness of breath.   Cardiovascular:  Positive for leg swelling. Negative for chest pain and palpitations.  Neurological:  Negative for tingling.   PHYSICAL EXAMINATION:  Vitals:   05/07/24 1321  BP: (!) 143/64  Pulse: (!) 53  SpO2: 100%  Weight: 230 lb 9.6 oz (104.6 kg)    Body mass index is 38.37 kg/m.  Physical Exam Vitals reviewed.  Cardiovascular:     Rate and Rhythm: Normal rate.  Pulmonary:     Effort: Pulmonary effort is normal.  Musculoskeletal:        General: Tenderness present.     Left lower leg: Edema (extends from thigh to foot) present.  Neurological:     Mental Status: She is alert.  Psychiatric:        Mood and Affect: Mood normal.    Villalta Score for Post-Thrombotic Syndrome: Pain: Mild Cramps: Absent Heaviness:  Absent Paresthesia: Absent Pruritus: Absent Pretibial Edema: Moderate Skin Induration: Absent Hyperpigmentation: Absent Redness: Absent Venous Ectasia: Absent Pain on calf compression: Mild Villalta Preliminary Score: 4 Is venous ulcer present?: No If venous ulcer is present and score is <15, then 15 points total are assigned: Absent Villalta Total Score: 4  LABS:  Outside labs: 12/01/23: AST 21, ALT 17, alk phos 102, Cr 2.0, eGFR 26 02/26/24: Hgb 13.6, platelets 229  CBC     Component Value Date/Time   WBC 7.5 12/22/2018 0830   RBC 5.02 12/22/2018 0830   HGB 12.8 12/22/2018 0830   HCT 41.1 12/22/2018 0830   PLT 285 12/22/2018 0830   MCV  81.9 12/22/2018 0830   MCH 25.5 (L) 12/22/2018 0830   MCHC 31.1 12/22/2018 0830   RDW 15.2 12/22/2018 0830   LYMPHSABS 2.1 12/22/2018 0830   MONOABS 0.8 12/22/2018 0830   EOSABS 0.1 12/22/2018 0830   BASOSABS 0.0 12/22/2018 0830    Hepatic Function      Component Value Date/Time   PROT 7.6 12/22/2018 0830   PROT 6.3 09/15/2017 0950   ALBUMIN 3.6 12/22/2018 0830   ALBUMIN 3.8 09/15/2017 0950   AST 34 12/22/2018 0830   ALT 30 12/22/2018 0830   ALKPHOS 61 12/22/2018 0830   BILITOT 0.7 12/22/2018 0830   BILITOT <0.2 09/15/2017 0950    Renal Function   Lab Results  Component Value Date   CREATININE 3.42 (H) 12/22/2018   CREATININE 2.69 (H) 09/28/2017   CREATININE 1.72 (H) 09/15/2017    CrCl cannot be calculated (Patient's most recent lab result is older than the maximum 21 days allowed.).   VVS Vascular Lab Studies:  05/07/24 VAS US  LOWER EXTREMITY VENOUS LEFT (DVT): Summary:  RIGHT:  - No evidence of common femoral vein obstruction.    LEFT:  - Findings consistent with acute deep vein thrombosis involving the left common femoral vein, SF junction, left femoral vein, left popliteal vein, left posterior tibial veins, and left peroneal veins.   ASSESSMENT: Location of DVT: Left distal vein, Left popliteal vein, Left femoral  vein, Left common femoral vein Cause of DVT: unprovoked  Patient without prior history of DVT diagnosed with acute DVT in the left common femoral, SF junction, femoral, popliteal, posterior tibial, and peroneal veins. Her only risk factor for DVT is obesity. Otherwise, no provoking factors identified today. Indicated to start on anticoagulation. Will start on Eliquis  VTE starter pack dosing. Outside labs reviewed, no concerns for starting Eliquis . Extensively counseled on Eliquis  and all patient questions were answered. Recommended to discontinue the recent supplement she started for energy given unable to confirm what this contains and this is the only recent change for her. Also recommended to discontinue Bayer aspirin powder due to increased risk of bleeding with Eliquis  and she was in agreement. Given DVT extends to the common femoral vein, discussed the patient with Dr. Gretta who came and evaluated the patient and discussed risks and benefits of vascular surgery intervention. Given her age and mild symptoms, the patient elected to avoid surgical intervention which is reasonable per Dr. Gretta. Fitted her for thigh high compression stockings today and counseled her to elevated her leg to help with swelling. Given no provoking factors were identified, will refer to hematology for further work up and management. Counseled her to reach out if she has any additional questions or concerns.  PLAN: -Start apixaban  (Eliquis ) 10 mg twice daily for 7 days followed by 5 mg twice daily. -Expected duration of therapy: per hematology. Therapy started on 05/07/2024. -Patient educated on purpose, proper use and potential adverse effects of apixaban  (Eliquis ). -Discussed importance of taking medication around the same time every day. -Advised patient of medications to avoid (NSAIDs, aspirin doses >100 mg daily). -Educated that Tylenol (acetaminophen) is the preferred analgesic to lower the risk of bleeding. -Advised  patient to alert all providers of anticoagulation therapy prior to starting a new medication or having a procedure. -Emphasized importance of monitoring for signs and symptoms of bleeding (abnormal bruising, prolonged bleeding, nose bleeds, bleeding from gums, discolored urine, black tarry stools). -Educated patient to present to the ED if emergent signs and symptoms of new thrombosis occur. -  Measured patient for compression stockings. -Counseled patient to wear compression stockings daily, removing at night.  Follow up: Referred to hematology, DVT clinic as needed  Izetta Henry, PharmD [facility] Clinical Pharmacist

## 2024-05-07 NOTE — Patient Instructions (Addendum)
-  Start apixaban  (Eliquis ) 10 mg twice daily for 7 days followed by 5 mg twice daily. -Your refills have been sent to [facility]. You may need to call the pharmacy to ask them to fill this when you start to run low on your current supply.  -It is important to take your medication around the same time every day.  -Avoid NSAIDs like ibuprofen (Advil, Motrin) and naproxen (Aleve) as well as aspirin doses over 100 mg daily. -Tylenol (acetaminophen) is the preferred over the counter pain medication to lower the risk of bleeding. -Be sure to alert all of your health care providers that you are taking an anticoagulant prior to starting a new medication or having a procedure. -Monitor for signs and symptoms of bleeding (abnormal bruising, prolonged bleeding, nose bleeds, bleeding from gums, discolored urine, black tarry stools). If you have fallen and hit your head OR if your bleeding is severe or not stopping, seek emergency care.  -Go to the emergency room if emergent signs and symptoms of new clot occur (new or worse swelling and pain in an arm or leg, shortness of breath, chest pain, fast or irregular heartbeats, lightheadedness, dizziness, fainting, coughing up blood) or if you experience a significant color change (pale or blue) in the extremity that has the DVT.  -We recommend you wear compression stockings (20-30 mmHg) as long as you are having swelling or pain. Be sure to purchase the correct size and take them off at night.   If you have any questions or need to reschedule an appointment, please call [phone]. If you are having an emergency, call 911 or present to the nearest emergency room.   What is a DVT?  -Deep vein thrombosis (DVT) is a condition in which a blood clot forms in a vein of the deep venous system which can occur in the lower leg, thigh, pelvis, arm, or neck. This condition is serious and can be life-threatening if the clot travels to the arteries of the lungs and causing a  blockage (pulmonary embolism, PE). A DVT can also damage veins in the leg, which can lead to long-term venous disease, leg pain, swelling, discoloration, and ulcers or sores (post-thrombotic syndrome).  -Treatment may include taking an anticoagulant medication to prevent more clots from forming and the current clot from growing, wearing compression stockings, and/or surgical procedures to remove or dissolve the clot.

## 2024-05-07 NOTE — Telephone Encounter (Signed)
 Patient Advocate Encounter  Test billing for this patients current coverage [facility]) returns a $0 copay for 90 day supply.  This test claim was processed through [facility]- copay amounts may vary at other pharmacies due to pharmacy/plan contracts, or as the patient moves through the different stages of their insurance plan.  Rachel DEL, CPhT Rx Patient Advocate Phone: [phone]

## 2024-05-12 DIAGNOSIS — N184 Chronic kidney disease, stage 4 (severe): Secondary | ICD-10-CM | POA: Diagnosis not present

## 2024-05-12 DIAGNOSIS — E1122 Type 2 diabetes mellitus with diabetic chronic kidney disease: Secondary | ICD-10-CM | POA: Diagnosis not present

## 2024-05-12 DIAGNOSIS — E113399 Type 2 diabetes mellitus with moderate nonproliferative diabetic retinopathy without macular edema, unspecified eye: Secondary | ICD-10-CM | POA: Diagnosis not present

## 2024-05-17 DIAGNOSIS — I82402 Acute embolism and thrombosis of unspecified deep veins of left lower extremity: Secondary | ICD-10-CM | POA: Diagnosis not present

## 2024-05-17 DIAGNOSIS — I10 Essential (primary) hypertension: Secondary | ICD-10-CM | POA: Diagnosis not present

## 2024-05-17 DIAGNOSIS — Z23 Encounter for immunization: Secondary | ICD-10-CM | POA: Diagnosis not present

## 2024-06-05 ENCOUNTER — Inpatient Hospital Stay: Payer: Medicare (Managed Care)

## 2024-06-05 ENCOUNTER — Inpatient Hospital Stay: Payer: Medicare (Managed Care) | Attending: Hematology and Oncology | Admitting: Hematology and Oncology

## 2024-06-05 VITALS — BP 142/85 | HR 70 | Temp 97.8°F | Resp 17 | Ht 65.0 in | Wt 229.5 lb

## 2024-06-05 DIAGNOSIS — Z7901 Long term (current) use of anticoagulants: Secondary | ICD-10-CM | POA: Insufficient documentation

## 2024-06-05 DIAGNOSIS — Z87891 Personal history of nicotine dependence: Secondary | ICD-10-CM | POA: Insufficient documentation

## 2024-06-05 DIAGNOSIS — I82412 Acute embolism and thrombosis of left femoral vein: Secondary | ICD-10-CM | POA: Diagnosis not present

## 2024-06-05 DIAGNOSIS — I82402 Acute embolism and thrombosis of unspecified deep veins of left lower extremity: Secondary | ICD-10-CM | POA: Insufficient documentation

## 2024-06-05 DIAGNOSIS — I82452 Acute embolism and thrombosis of left peroneal vein: Secondary | ICD-10-CM | POA: Insufficient documentation

## 2024-06-05 DIAGNOSIS — I82442 Acute embolism and thrombosis of left tibial vein: Secondary | ICD-10-CM | POA: Diagnosis not present

## 2024-06-05 DIAGNOSIS — I82432 Acute embolism and thrombosis of left popliteal vein: Secondary | ICD-10-CM | POA: Diagnosis not present

## 2024-06-05 NOTE — Progress Notes (Signed)
 [facility] CONSULT NOTE  Patient Care Team: Eliodoro Dawna FALCON, FNP as PCP - General (Family Medicine) Raford Riggs, MD as PCP - Cardiology (Cardiology)  CHIEF COMPLAINTS/PURPOSE OF CONSULTATION:  Left leg DVT  HISTORY OF PRESENTING ILLNESS:    History of Present Illness Ashley Sullivan is a 73 year old female who presents with a recent blood clot in the left leg. She was referred by her primary doctor for evaluation of a recent blood clot.  Three weeks ago, she experienced intermittent swelling in her left leg, which subsided at night but persisted during the day. An ultrasound revealed a long blood clot extending from the upper to the lower part of the left leg. She is currently on blood thinners.  She denies recent long-distance travel, recent surgeries, or having any foreign bodies like catheters. She also denies smoking.  Her family history includes her mother having strokes and heart attacks, and her siblings having amputations due to diabetes. There is no known family history of blood clots.    I reviewed her records extensively and collaborated the history with the patient.   MEDICAL HISTORY:  Past Medical History:  Diagnosis Date   Carotid stenosis 09/15/2017   Diabetes mellitus without complication (HCC)    Essential hypertension 09/15/2017   Hypertension    Morbid obesity (HCC) 09/15/2017    SURGICAL HISTORY: Past Surgical History:  Procedure Laterality Date   TONSILLECTOMY      SOCIAL HISTORY: Social History   Socioeconomic History   Marital status: Married    Spouse name: Not on file   Number of children: Not on file   Years of education: Not on file   Highest education level: Not on file  Occupational History   Not on file  Tobacco Use   Smoking status: Former    Current packs/day: 0.00    Types: Cigarettes    Quit date: 09/12/2017    Years since quitting: 6.7   Smokeless tobacco: Never  Vaping Use   Vaping status: Never Used   Substance and Sexual Activity   Alcohol use: No   Drug use: No   Sexual activity: Yes    Comment: 1st intercourse 42 yo-5 partners  Other Topics Concern   Not on file  Social History Narrative   Not on file   Social Drivers of Health   Financial Resource Strain: Not on file  Food Insecurity: No Food Insecurity (06/05/2024)   Hunger Vital Sign    Worried About Running Out of Food in the Last Year: Never true    Ran Out of Food in the Last Year: Never true  Transportation Needs: No Transportation Needs (06/05/2024)   PRAPARE - Administrator, Civil Service (Medical): No    Lack of Transportation (Non-Medical): No  Physical Activity: Not on file  Stress: Not on file  Social Connections: Not on file  Intimate Partner Violence: Not At Risk (06/05/2024)   Humiliation, Afraid, Rape, and Kick questionnaire    Fear of Current or Ex-Partner: No    Emotionally Abused: No    Physically Abused: No    Sexually Abused: No    FAMILY HISTORY: Family History  Problem Relation Age of Onset   Diabetes Mother    Heart failure Mother    Heart attack Mother    Diabetes Sister    Diabetes Sister    Diabetes Sister     ALLERGIES:  has no known allergies.  MEDICATIONS:  Current Outpatient Medications  Medication Sig Dispense Refill   ALPHAGAN P 0.1 % SOLN Instill 1 drop every day by ophthalmic route for 60 days.     amLODipine (NORVASC) 10 MG tablet Take 10 mg by mouth daily.     apixaban  (ELIQUIS ) 5 MG TABS tablet Take 1 tablet (5 mg total) by mouth 2 (two) times daily. Start taking after completion of starter pack. 60 tablet 1   Apixaban  Starter Pack, 10mg  and 5mg , (ELIQUIS  DVT/PE STARTER PACK) Take as directed on package: start with two-5mg  tablets by mouth twice daily for 7 days. On day 8, switch to one-5mg  tablet twice daily. 74 each 0   Cholecalciferol 50 MCG (2000 UT) CAPS Take 1 capsule by mouth daily.     Insulin Degludec (TRESIBA FLEXTOUCH [place]) Inject into the skin.      JARDIANCE 10 MG TABS tablet Take 10 mg by mouth daily.     lisinopril (ZESTRIL) 10 MG tablet Take 10 mg by mouth daily.     MOUNJARO 5 MG/0.5ML Pen Inject 5 mg into the skin once a week.     pioglitazone (ACTOS) 15 MG tablet Take 15 mg by mouth daily.     rosuvastatin (CRESTOR) 20 MG tablet Take 20 mg by mouth daily.     No current facility-administered medications for this visit.    REVIEW OF SYSTEMS:   Constitutional: Denies fevers, chills or abnormal night sweats All other systems were reviewed with the patient and are negative.  PHYSICAL EXAMINATION: ECOG PERFORMANCE STATUS: 1 - Symptomatic but completely ambulatory  Vitals:   06/05/24 1408  BP: (!) 142/85  Pulse: 70  Resp: 17  Temp: 97.8 F (36.6 C)  SpO2: 100%   Filed Weights   06/05/24 1408  Weight: 229 lb 8 oz (104.1 kg)    GENERAL:alert, no distress and comfortable  LABORATORY DATA:  I have reviewed the data as listed Lab Results  Component Value Date   WBC 7.5 12/22/2018   HGB 12.8 12/22/2018   HCT 41.1 12/22/2018   MCV 81.9 12/22/2018   PLT 285 12/22/2018   Lab Results  Component Value Date   NA 139 12/22/2018   K 3.0 (L) 12/22/2018   CL 102 12/22/2018   CO2 26 12/22/2018    RADIOGRAPHIC STUDIES: I have personally reviewed the radiological reports and agreed with the findings in the report.  ASSESSMENT AND PLAN:  Leg DVT (deep venous thromboembolism), acute, left (HCC) 05/07/2024: Left leg ultrasound: Acute DVT involving left common femoral vein, superficial femoral junction, left femoral vein, left popliteal vein, left posterior tibial veins and left peroneal veins  I discussed with the patient risk factors for blood clots.  Inherited risk factors include: 1. Factor V Leiden mutation 2. Prothrombin gene G20210A 3. Protein S deficiency  4. Protein C deficiency  5. Antithrombin deficiency  Acquired risk factors include: 1. Antiphospholipid antibody syndrome 2. Tobacco use: None 3. Obesity:  Patient is overweight 4. Medications including oral contraceptives: None 5. Sedentary behavior including postoperative state: Patient reports that she has been retired and does not stay too active but is not sedentary. 6. Foreign bodies in circulation: None 7.  She has kept up with her routine screening surveillance for breast cancers including getting a mammogram colonoscopy and a Pap test in the last 1 to 2 years  Workup recommended: Bloodwork to evaluate for the 5 inherited factors mentioned above along with antiphospholipid antibodies. Return to clinic in one to 4 weeks to discuss the results of the tests Patient  is going to [place] with several friends for vacation.  I discussed with her that she needs to move on the airplane so that she does not increase her risk of blood clots.  She is on anticoagulation and that should protect her.      All questions were answered. The patient knows to call the clinic with any problems, questions or concerns.    Viinay K Raelee Rossmann, MD 06/05/24

## 2024-06-05 NOTE — Assessment & Plan Note (Signed)
 05/07/2024: Left leg ultrasound: Acute DVT involving left common femoral vein, superficial femoral junction, left femoral vein, left popliteal vein, left posterior tibial veins and left peroneal veins  I discussed with the patient risk factors for blood clots.  Inherited risk factors include: 1. Factor V Leiden mutation 2. Prothrombin gene G20210A 3. Protein S deficiency  4. Protein C deficiency  5. Antithrombin deficiency  Acquired risk factors include: 1. Antiphospholipid antibody syndrome 2. Tobacco use 3. Obesity 4. Medications including oral contraceptives 5. Sedentary behavior including postoperative state 6. Foreign bodies in circulation  Workup recommended: Bloodwork to evaluate for the 5 inherited factors mentioned above along with antiphospholipid antibodies. Return to clinic in one to 2 weeks to discuss the results of the tests

## 2024-06-06 LAB — PROTEIN S, TOTAL: Protein S Ag, Total: 164 % — ABNORMAL HIGH (ref 60–150)

## 2024-06-06 LAB — BETA-2-GLYCOPROTEIN I ABS, IGG/M/A
Beta-2 Glyco I IgG: <9 GPI IgG units (ref 0–20)
Beta-2-Glycoprotein I IgA: <9 GPI IgA units (ref 0–25)
Beta-2-Glycoprotein I IgM: <9 GPI IgM units (ref 0–32)

## 2024-06-06 LAB — ANTITHROMBIN III ANTIGEN: AT III AG PPP IMM-ACNC: 121 % (ref 72–124)

## 2024-06-07 LAB — DRVVT CONFIRM: dRVVT Confirm: 0.7 ratio — ABNORMAL LOW (ref 0.8–1.2)

## 2024-06-07 LAB — DRVVT MIX: dRVVT Mix: 53 s — ABNORMAL HIGH (ref 0.0–40.4)

## 2024-06-07 LAB — LUPUS ANTICOAGULANT PANEL
DRVVT: 71.7 s — ABNORMAL HIGH (ref 0.0–47.0)
PTT Lupus Anticoagulant: 36.2 s (ref 0.0–43.5)

## 2024-06-07 LAB — PROTEIN C, TOTAL: Protein C, Total: 147 % (ref 60–150)

## 2024-06-08 LAB — CARDIOLIPIN ANTIBODIES, IGG, IGM, IGA
Anticardiolipin IgA: <9 U/mL (ref 0–11)
Anticardiolipin IgG: <9 GPL U/mL (ref 0–14)
Anticardiolipin IgM: <9 [MPL'U]/mL (ref 0–12)

## 2024-06-10 LAB — PROTHROMBIN GENE MUTATION

## 2024-06-10 LAB — FACTOR 5 LEIDEN

## 2024-06-11 ENCOUNTER — Other Ambulatory Visit: Payer: Self-pay | Admitting: Vascular Surgery

## 2024-06-11 DIAGNOSIS — N184 Chronic kidney disease, stage 4 (severe): Secondary | ICD-10-CM | POA: Diagnosis not present

## 2024-06-11 DIAGNOSIS — N189 Chronic kidney disease, unspecified: Secondary | ICD-10-CM | POA: Diagnosis not present

## 2024-06-11 DIAGNOSIS — E1122 Type 2 diabetes mellitus with diabetic chronic kidney disease: Secondary | ICD-10-CM | POA: Diagnosis not present

## 2024-06-11 DIAGNOSIS — N2581 Secondary hyperparathyroidism of renal origin: Secondary | ICD-10-CM | POA: Diagnosis not present

## 2024-06-11 DIAGNOSIS — I82412 Acute embolism and thrombosis of left femoral vein: Secondary | ICD-10-CM

## 2024-06-11 DIAGNOSIS — I129 Hypertensive chronic kidney disease with stage 1 through stage 4 chronic kidney disease, or unspecified chronic kidney disease: Secondary | ICD-10-CM | POA: Diagnosis not present

## 2024-06-11 DIAGNOSIS — N1832 Chronic kidney disease, stage 3b: Secondary | ICD-10-CM | POA: Diagnosis not present

## 2024-06-11 DIAGNOSIS — E119 Type 2 diabetes mellitus without complications: Secondary | ICD-10-CM | POA: Diagnosis not present

## 2024-06-11 DIAGNOSIS — E113399 Type 2 diabetes mellitus with moderate nonproliferative diabetic retinopathy without macular edema, unspecified eye: Secondary | ICD-10-CM | POA: Diagnosis not present

## 2024-07-03 ENCOUNTER — Other Ambulatory Visit: Payer: Self-pay | Admitting: *Deleted

## 2024-07-03 ENCOUNTER — Inpatient Hospital Stay: Payer: Medicare (Managed Care) | Attending: Hematology and Oncology | Admitting: Hematology and Oncology

## 2024-07-03 DIAGNOSIS — I82412 Acute embolism and thrombosis of left femoral vein: Secondary | ICD-10-CM | POA: Diagnosis not present

## 2024-07-03 DIAGNOSIS — I82402 Acute embolism and thrombosis of unspecified deep veins of left lower extremity: Secondary | ICD-10-CM

## 2024-07-03 DIAGNOSIS — Z7901 Long term (current) use of anticoagulants: Secondary | ICD-10-CM

## 2024-07-03 DIAGNOSIS — I82442 Acute embolism and thrombosis of left tibial vein: Secondary | ICD-10-CM

## 2024-07-03 DIAGNOSIS — I82452 Acute embolism and thrombosis of left peroneal vein: Secondary | ICD-10-CM | POA: Diagnosis not present

## 2024-07-03 NOTE — Progress Notes (Signed)
 HEMATOLOGY-ONCOLOGY TELEPHONE VISIT PROGRESS NOTE  I connected with our patient on 07/03/24 at 10:30 AM EDT by telephone and verified that I am speaking with the correct person using two identifiers.  I discussed the limitations, risks, security and privacy concerns of performing an evaluation and management service by telephone and the availability of in person appointments.  I also discussed with the patient that there may be a patient responsible charge related to this service. The patient expressed understanding and agreed to proceed.   History of Present Illness: Follow-up to discuss results of blood work for hypercoagulability  History of Present Illness Ashley Sullivan is a 73 year old female who presents for follow-up regarding her blood thinner therapy.  Recent blood work shows normal results with no inherited genetic risks. The antibody test is negative, and all proteins and antithrombin levels are within normal ranges. She is currently on a blood thinner.  REVIEW OF SYSTEMS:   Constitutional: Denies fevers, chills or abnormal weight loss All other systems were reviewed with the patient and are negative. 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  Risk factor for DVT: Obesity, sedentary behavior  Patient went to [place] for vacation (she walked a lot on this vacation)  Recommendation:  6 months of anticoagulation which will be completed in February 2025. We will obtain ultrasound of her left leg and if there is no evidence of DVT then she can come off  anticoagulation. I encouraged her to remain active and work on her weight. --------------------------------- Assessment and Plan Assessment & Plan Acute left lower extremity deep vein thrombosis (DVT) Managed with anticoagulation. No inherited thrombophilia. Risk factors: reduced activity, obesity. - Continue Apixaban  5 mg orally twice daily until February. - Schedule ultrasound of left leg in February to assess DVT resolution. - Reassess anticoagulation therapy based on ultrasound results in February.      I discussed the assessment and treatment plan with the patient. The patient was provided an opportunity to ask questions and all were answered. The patient agreed with the plan and demonstrated an understanding of the instructions. The patient was advised to call back or seek an in-person evaluation if the symptoms worsen or if the condition fails to improve as anticipated.   I provided 20 minutes of non-face-to-face time during this encounter.  This includes time for charting and coordination of care   Naomi MARLA Chad, MD

## 2024-07-03 NOTE — Assessment & Plan Note (Signed)
 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  Risk factor for DVT: Obesity, sedentary behavior  Patient went to [place] for vacation Recommendation: 6 months of anticoagulation which will be completed in February 2025.

## 2024-07-17 DIAGNOSIS — Z86718 Personal history of other venous thrombosis and embolism: Secondary | ICD-10-CM | POA: Diagnosis not present

## 2024-07-17 DIAGNOSIS — E1122 Type 2 diabetes mellitus with diabetic chronic kidney disease: Secondary | ICD-10-CM | POA: Diagnosis not present

## 2024-07-17 DIAGNOSIS — Z7985 Long-term (current) use of injectable non-insulin antidiabetic drugs: Secondary | ICD-10-CM | POA: Diagnosis not present

## 2024-07-17 DIAGNOSIS — N184 Chronic kidney disease, stage 4 (severe): Secondary | ICD-10-CM | POA: Diagnosis not present

## 2024-07-17 DIAGNOSIS — E66812 Obesity, class 2: Secondary | ICD-10-CM | POA: Diagnosis not present

## 2024-07-17 DIAGNOSIS — E113399 Type 2 diabetes mellitus with moderate nonproliferative diabetic retinopathy without macular edema, unspecified eye: Secondary | ICD-10-CM | POA: Diagnosis not present

## 2024-08-11 DIAGNOSIS — E1122 Type 2 diabetes mellitus with diabetic chronic kidney disease: Secondary | ICD-10-CM | POA: Diagnosis not present

## 2024-08-11 DIAGNOSIS — N184 Chronic kidney disease, stage 4 (severe): Secondary | ICD-10-CM | POA: Diagnosis not present

## 2024-08-11 DIAGNOSIS — E119 Type 2 diabetes mellitus without complications: Secondary | ICD-10-CM | POA: Diagnosis not present

## 2024-08-11 DIAGNOSIS — E1169 Type 2 diabetes mellitus with other specified complication: Secondary | ICD-10-CM | POA: Diagnosis not present

## 2024-08-11 DIAGNOSIS — E113399 Type 2 diabetes mellitus with moderate nonproliferative diabetic retinopathy without macular edema, unspecified eye: Secondary | ICD-10-CM | POA: Diagnosis not present

## 2024-08-12 DIAGNOSIS — N184 Chronic kidney disease, stage 4 (severe): Secondary | ICD-10-CM | POA: Diagnosis not present

## 2024-08-28 DIAGNOSIS — N184 Chronic kidney disease, stage 4 (severe): Secondary | ICD-10-CM | POA: Diagnosis not present

## 2024-09-09 ENCOUNTER — Other Ambulatory Visit (HOSPITAL_COMMUNITY): Payer: Self-pay

## 2024-11-01 ENCOUNTER — Ambulatory Visit (HOSPITAL_COMMUNITY): Payer: Medicare (Managed Care)
